# Patient Record
Sex: Female | Born: 1970 | Race: White | Hispanic: No | Marital: Married | Smoking: Current every day smoker
Health system: Southern US, Community
[De-identification: ages and names within clinical notes are randomized; demographics above are authoritative.]

## PROBLEM LIST (undated history)

## (undated) DIAGNOSIS — Z789 Other specified health status: Secondary | ICD-10-CM

## (undated) HISTORY — PX: ABDOMINAL HYSTERECTOMY: SHX81

## (undated) HISTORY — PX: TOTAL ABDOMINAL HYSTERECTOMY: SHX209

## (undated) HISTORY — DX: Other specified health status: Z78.9

## (undated) HISTORY — PX: APPENDECTOMY: SHX54

---

## 2010-01-16 ENCOUNTER — Encounter: Admission: RE | Admit: 2010-01-16 | Discharge: 2010-01-16 | Payer: Self-pay | Admitting: Family Medicine

## 2010-04-01 ENCOUNTER — Encounter: Payer: Self-pay | Admitting: Family Medicine

## 2010-04-13 ENCOUNTER — Other Ambulatory Visit: Payer: Self-pay | Admitting: Family Medicine

## 2010-04-13 DIAGNOSIS — G5700 Lesion of sciatic nerve, unspecified lower limb: Secondary | ICD-10-CM

## 2010-04-13 DIAGNOSIS — M545 Low back pain: Secondary | ICD-10-CM

## 2010-04-13 DIAGNOSIS — M5416 Radiculopathy, lumbar region: Secondary | ICD-10-CM

## 2010-04-18 ENCOUNTER — Other Ambulatory Visit: Payer: Self-pay | Admitting: Family Medicine

## 2010-04-18 DIAGNOSIS — G5700 Lesion of sciatic nerve, unspecified lower limb: Secondary | ICD-10-CM

## 2010-04-24 ENCOUNTER — Ambulatory Visit
Admission: RE | Admit: 2010-04-24 | Discharge: 2010-04-24 | Disposition: A | Discharge status: Home / Self Care | Payer: BC Managed Care – PPO | Source: Ambulatory Visit | Attending: Family Medicine | Admitting: Family Medicine

## 2010-04-24 DIAGNOSIS — G5700 Lesion of sciatic nerve, unspecified lower limb: Secondary | ICD-10-CM

## 2010-04-24 DIAGNOSIS — M545 Low back pain: Secondary | ICD-10-CM

## 2010-04-24 DIAGNOSIS — M5416 Radiculopathy, lumbar region: Secondary | ICD-10-CM

## 2010-07-23 ENCOUNTER — Other Ambulatory Visit: Payer: Self-pay | Admitting: *Deleted

## 2010-07-23 DIAGNOSIS — M545 Low back pain, unspecified: Secondary | ICD-10-CM

## 2010-07-23 DIAGNOSIS — M5410 Radiculopathy, site unspecified: Secondary | ICD-10-CM

## 2010-07-23 DIAGNOSIS — G5700 Lesion of sciatic nerve, unspecified lower limb: Secondary | ICD-10-CM

## 2010-07-27 ENCOUNTER — Other Ambulatory Visit: Payer: Self-pay | Admitting: *Deleted

## 2010-07-27 ENCOUNTER — Ambulatory Visit
Admission: RE | Admit: 2010-07-27 | Discharge: 2010-07-27 | Disposition: A | Discharge status: Home / Self Care | Payer: BC Managed Care – PPO | Source: Ambulatory Visit | Attending: *Deleted | Admitting: *Deleted

## 2010-07-27 DIAGNOSIS — G5700 Lesion of sciatic nerve, unspecified lower limb: Secondary | ICD-10-CM

## 2010-07-27 DIAGNOSIS — M5410 Radiculopathy, site unspecified: Secondary | ICD-10-CM

## 2010-07-27 DIAGNOSIS — M545 Low back pain: Secondary | ICD-10-CM

## 2010-11-27 ENCOUNTER — Other Ambulatory Visit: Payer: Self-pay | Admitting: *Deleted

## 2010-11-27 DIAGNOSIS — M545 Low back pain: Secondary | ICD-10-CM

## 2010-11-27 DIAGNOSIS — G5700 Lesion of sciatic nerve, unspecified lower limb: Secondary | ICD-10-CM

## 2010-11-27 DIAGNOSIS — M5410 Radiculopathy, site unspecified: Secondary | ICD-10-CM

## 2010-12-06 ENCOUNTER — Inpatient Hospital Stay
Admission: RE | Admit: 2010-12-06 | Payer: BC Managed Care – PPO | Source: Ambulatory Visit | Attending: *Deleted | Admitting: *Deleted

## 2010-12-11 ENCOUNTER — Other Ambulatory Visit: Payer: Self-pay | Admitting: Family Medicine

## 2010-12-11 ENCOUNTER — Ambulatory Visit
Admission: RE | Admit: 2010-12-11 | Discharge: 2010-12-11 | Disposition: A | Discharge status: Home / Self Care | Payer: BC Managed Care – PPO | Source: Ambulatory Visit | Attending: Family Medicine | Admitting: Family Medicine

## 2010-12-11 ENCOUNTER — Ambulatory Visit
Admission: RE | Admit: 2010-12-11 | Discharge: 2010-12-11 | Disposition: A | Discharge status: Home / Self Care | Payer: BC Managed Care – PPO | Source: Ambulatory Visit | Attending: *Deleted | Admitting: *Deleted

## 2010-12-11 DIAGNOSIS — M5410 Radiculopathy, site unspecified: Secondary | ICD-10-CM

## 2010-12-11 DIAGNOSIS — M545 Low back pain: Secondary | ICD-10-CM

## 2010-12-11 DIAGNOSIS — G5700 Lesion of sciatic nerve, unspecified lower limb: Secondary | ICD-10-CM

## 2010-12-27 ENCOUNTER — Other Ambulatory Visit: Payer: Self-pay | Admitting: Family Medicine

## 2010-12-27 DIAGNOSIS — M5410 Radiculopathy, site unspecified: Secondary | ICD-10-CM

## 2010-12-27 DIAGNOSIS — M545 Low back pain: Secondary | ICD-10-CM

## 2011-01-22 ENCOUNTER — Inpatient Hospital Stay: Admission: RE | Admit: 2011-01-22 | Payer: BC Managed Care – PPO | Source: Ambulatory Visit

## 2012-06-01 ENCOUNTER — Other Ambulatory Visit: Payer: Self-pay | Admitting: Neurosurgery

## 2012-06-01 DIAGNOSIS — M48061 Spinal stenosis, lumbar region without neurogenic claudication: Secondary | ICD-10-CM

## 2012-06-08 ENCOUNTER — Other Ambulatory Visit: Payer: Self-pay | Admitting: Neurosurgery

## 2012-06-08 ENCOUNTER — Ambulatory Visit
Admission: RE | Admit: 2012-06-08 | Discharge: 2012-06-08 | Disposition: A | Discharge status: Home / Self Care | Payer: BC Managed Care – PPO | Source: Ambulatory Visit | Attending: Neurosurgery | Admitting: Neurosurgery

## 2012-06-08 DIAGNOSIS — M48061 Spinal stenosis, lumbar region without neurogenic claudication: Secondary | ICD-10-CM

## 2018-04-16 DIAGNOSIS — R1011 Right upper quadrant pain: Secondary | ICD-10-CM | POA: Diagnosis not present

## 2018-04-16 DIAGNOSIS — R1013 Epigastric pain: Secondary | ICD-10-CM | POA: Diagnosis not present

## 2018-04-16 DIAGNOSIS — R197 Diarrhea, unspecified: Secondary | ICD-10-CM | POA: Diagnosis not present

## 2018-04-16 DIAGNOSIS — R110 Nausea: Secondary | ICD-10-CM | POA: Diagnosis not present

## 2018-04-20 DIAGNOSIS — R1011 Right upper quadrant pain: Secondary | ICD-10-CM | POA: Diagnosis not present

## 2018-04-20 DIAGNOSIS — Z0189 Encounter for other specified special examinations: Secondary | ICD-10-CM | POA: Diagnosis not present

## 2018-08-11 ENCOUNTER — Emergency Department
Admission: EM | Admit: 2018-08-11 | Discharge: 2018-08-11 | Disposition: A | Discharge status: Home / Self Care | Payer: BLUE CROSS/BLUE SHIELD | Source: Home / Self Care | Attending: Family Medicine | Admitting: Family Medicine

## 2018-08-11 ENCOUNTER — Ambulatory Visit (INDEPENDENT_AMBULATORY_CARE_PROVIDER_SITE_OTHER): Payer: BLUE CROSS/BLUE SHIELD | Admitting: Family Medicine

## 2018-08-11 ENCOUNTER — Emergency Department (INDEPENDENT_AMBULATORY_CARE_PROVIDER_SITE_OTHER): Payer: BLUE CROSS/BLUE SHIELD

## 2018-08-11 ENCOUNTER — Other Ambulatory Visit: Payer: Self-pay

## 2018-08-11 DIAGNOSIS — X58XXXA Exposure to other specified factors, initial encounter: Secondary | ICD-10-CM

## 2018-08-11 DIAGNOSIS — S92501A Displaced unspecified fracture of right lesser toe(s), initial encounter for closed fracture: Secondary | ICD-10-CM

## 2018-08-11 DIAGNOSIS — S92511A Displaced fracture of proximal phalanx of right lesser toe(s), initial encounter for closed fracture: Secondary | ICD-10-CM | POA: Diagnosis not present

## 2018-08-11 IMAGING — DX RIGHT FOOT COMPLETE - 3+ VIEW
3 series · 3 of 3 positions shown · non-contrast
Comparison: None.

CLINICAL DATA: Trauma to fourth digit.  Pain.

EXAM:
RIGHT FOOT COMPLETE - 3+ VIEW

[foot ap]
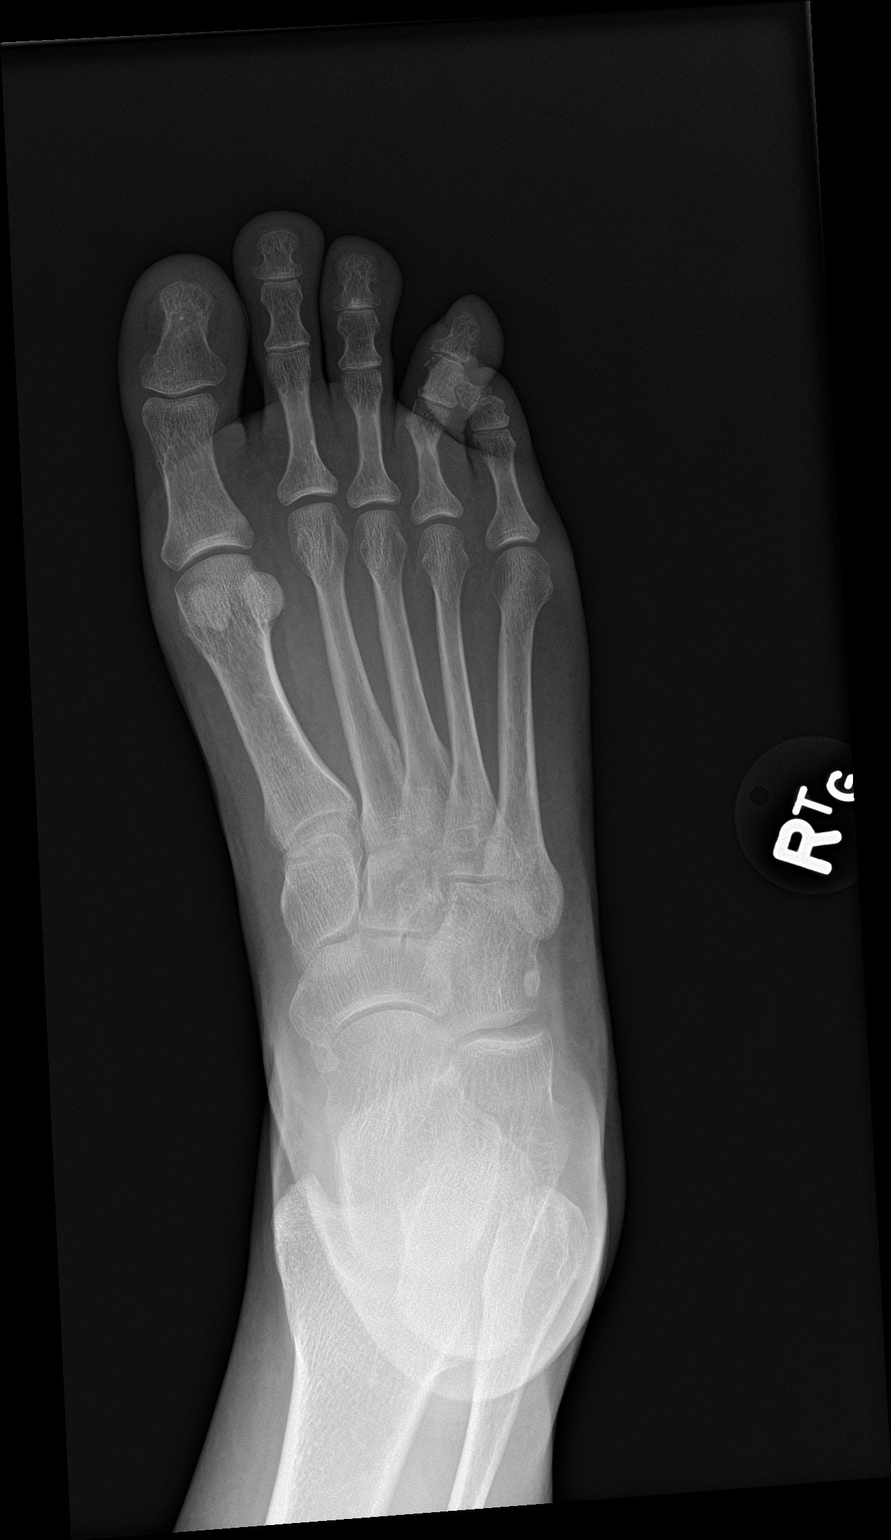

[foot obl]
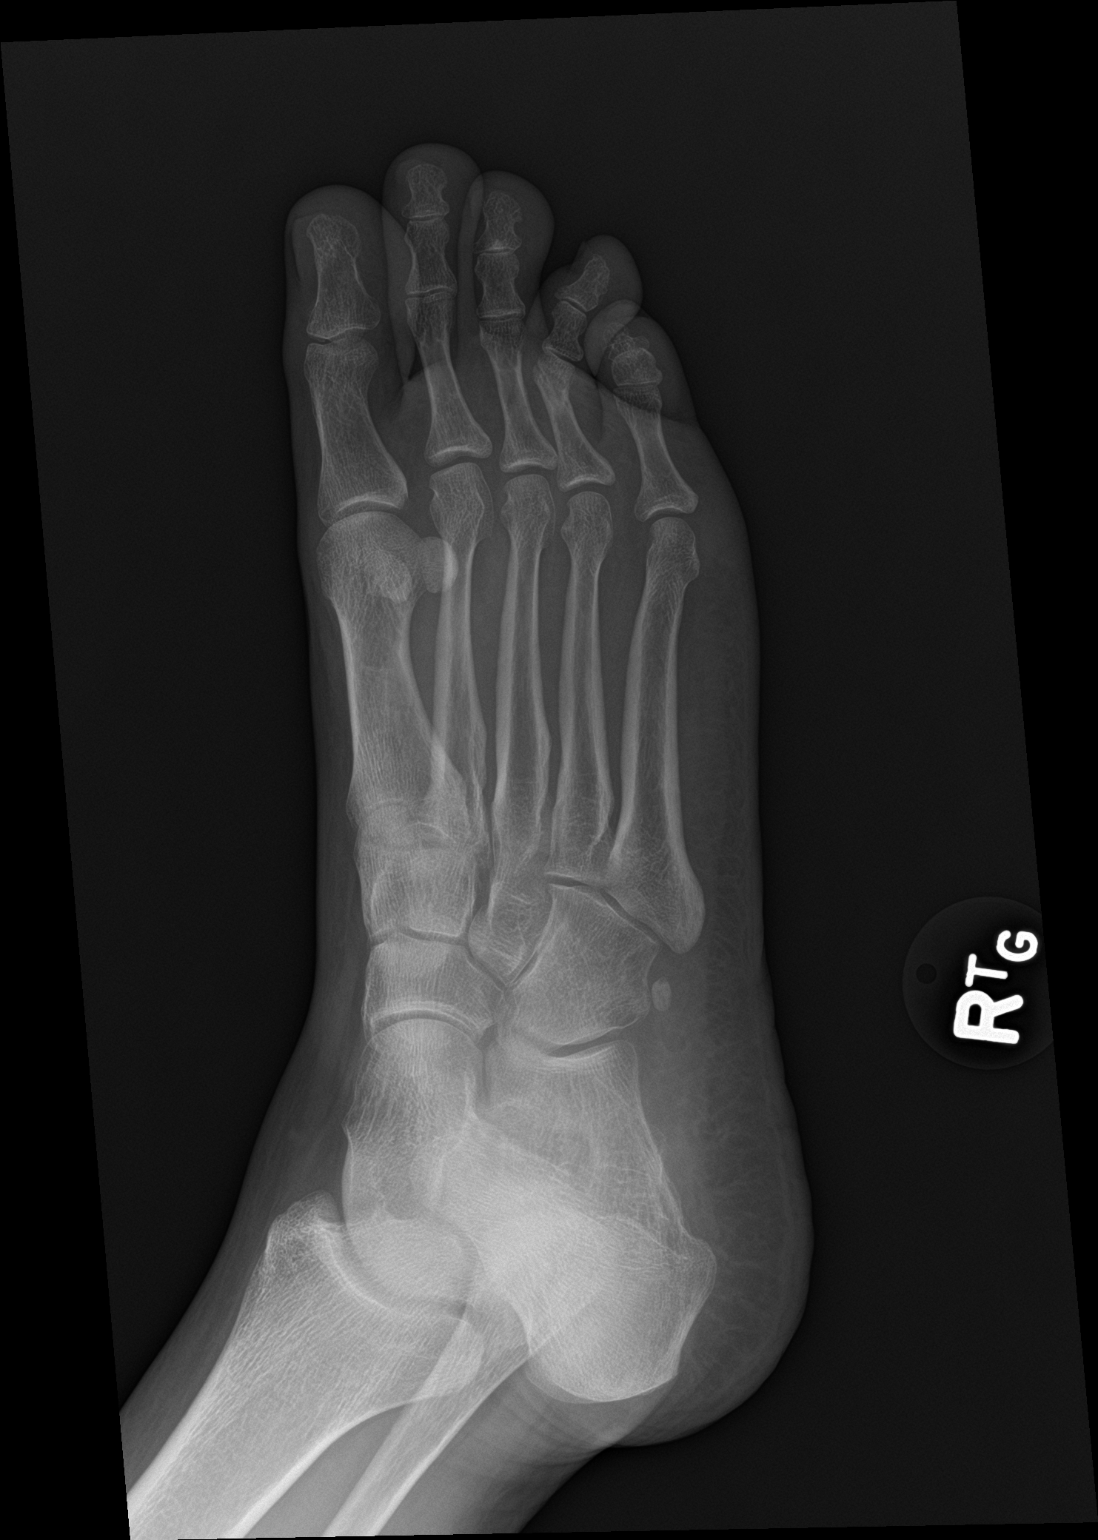

[foot lat]
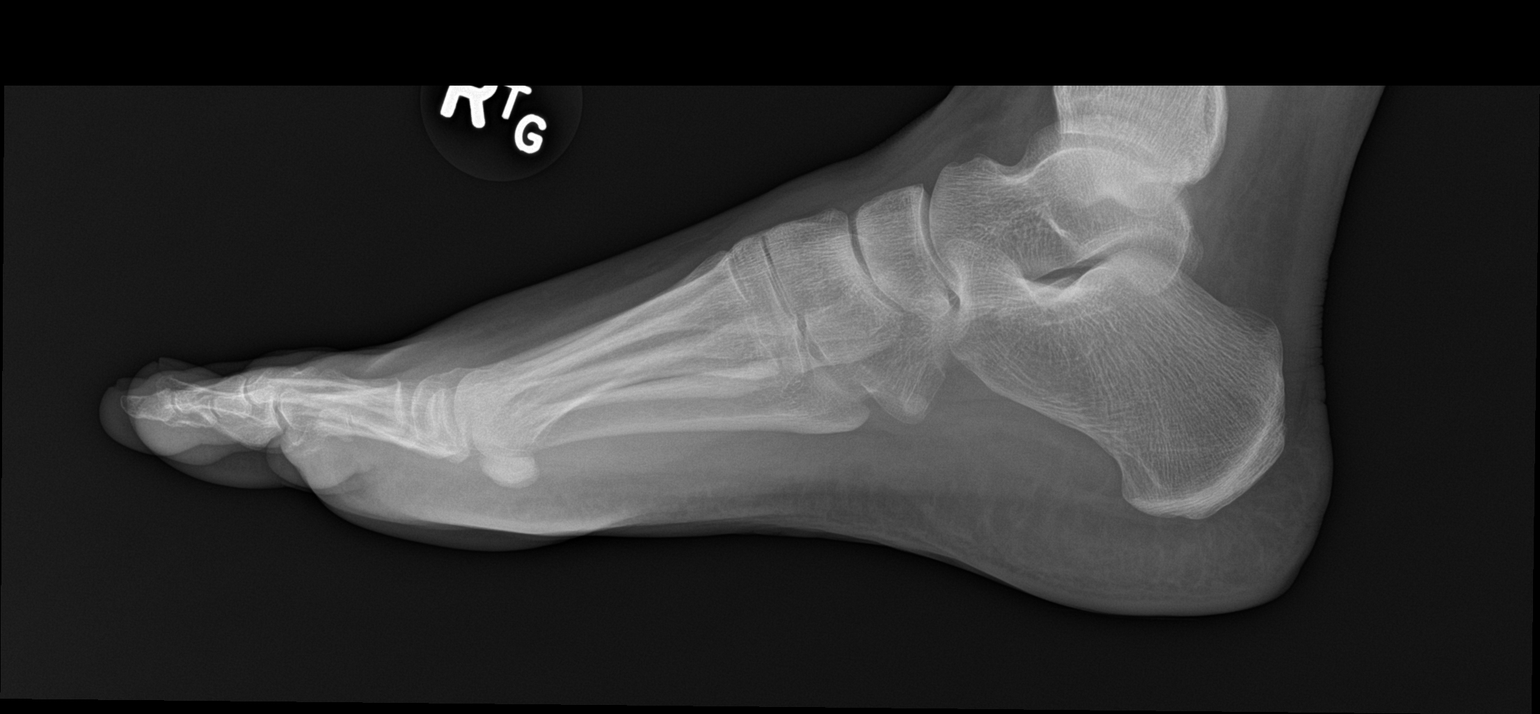

[3 of 3 positions shown; findings below may reference images not displayed]

FINDINGS: There is a obliquely oriented fracture deformity involving the
distal shaft of the fourth proximal phalanx. There is lateral
angulation of the distal fracture fragments. No dislocation
identified.
IMPRESSION: 1. Fracture of the distal shaft of fourth proximal phalanx with
lateral angulation of the distal fracture fragments.

## 2018-08-11 NOTE — Progress Notes (Signed)
    Subjective:    CC: Right fourth toe fracture.  HPI: Starting today stubbed right 4th toe.  She developed immediate pain.  She noticed that the fourth toe was displaced to the lateral foot.  She presents to urgent care today for evaluation and treatment.  In urgent care she was found to have a displaced angulated fracture at the proximal phalanx of the fourth toe.  I was consulted for evaluation of potential fracture reduction.   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    Pertinent medical history reviewed.  Review of Systems: No headache, visual changes, nausea, vomiting, diarrhea, constipation, dizziness, abdominal pain, skin rash, fevers, chills, night sweats, weight loss, swollen lymph nodes, body aches, joint swelling, muscle aches, chest pain, shortness of breath, mood changes, visual or auditory hallucinations.   Objective:   BP 137/82 (BP Location: Right Arm)   Pulse 87   Temp 98.5 F (36.9 C) (Tympanic)   Resp 20   Ht 5\' 4"  (1.626 m)   Wt 64.4 kg   LMP 04/09/2010   SpO2 99%   BMI 24.37 kg/m General: Well Developed, well nourished, and in no acute distress.  Neuro/Psych: Alert and oriented x3, extra-ocular muscles intact, able to move all 4 extremities, sensation grossly intact. Skin: Warm and dry, no rashes noted.  Respiratory: Not using accessory muscles, speaking in full sentences, trachea midline.  Cardiovascular: Pulses palpable, no extremity edema. Abdomen: Does not appear distended. MSK:  Right foot angulated fourth toe displaced laterally.  Ecchymosis present.  Capillary fill and sensation are intact distally.  Pulses intact at the foot.  Toe motion otherwise normal.  Fracture reduction: Consent obtained and timeout performed. Skin cleaned with rubbing alcohol and digital block achieved good anesthesia. Fracture was elongated and moved more medially achieving good anatomical alignment.  Buddy tape was applied.   Patient tolerated procedure well.  Lab and Radiology Results No results found for this or any previous visit (from the past 72 hour(s)). Dg Foot Complete Right  Result Date: 08/11/2018 CLINICAL DATA:  Trauma to fourth digit.  Pain. EXAM: RIGHT FOOT COMPLETE - 3+ VIEW COMPARISON:  None. FINDINGS: There is a obliquely oriented fracture deformity involving the distal shaft of the fourth proximal phalanx. There is lateral angulation of the distal fracture fragments. No dislocation identified. IMPRESSION: 1. Fracture of the distal shaft of fourth proximal phalanx with lateral angulation of the distal fracture fragments. Electronically Signed   By: Signa Kell M.D.   On: 08/11/2018 11:02  I personally (independently) visualized and performed the interpretation of the images attached in this note.   Impression and Recommendations:    Assessment and Plan: 48 y.o. female with right fourth toe fracture.  Doing well status post reduction.  Patient declined follow-up postreduction films which I think is pretty reasonable since she had good anatomical alignment.  Plan to recheck in about 1 week.  Proceed with postop shoe.  Recheck sooner if needed.  Discussed medication management if needed.  Patient will use over-the-counter prescription strength some medications..    Discussed warning signs or symptoms. Please see discharge instructions. Patient expresses understanding.

## 2018-08-11 NOTE — ED Provider Notes (Signed)
[name]    CSN: [id number] Arrival date & time: 08/11/18  7116     History   Chief Complaint Chief Complaint  Patient presents with  . Foot Injury    HPI Tammy Casey is a 48 y.o. female.   At about 4am today patient bumped her right foot against a chest, resulting in pain and deformity to her right 4th toe.  The history is provided by the patient.  Toe Pain  This is a new problem. The current episode started 6 to 12 hours ago. The problem occurs constantly. The problem has not changed since onset.The symptoms are aggravated by walking. Nothing relieves the symptoms. She has tried nothing for the symptoms.    History reviewed. No pertinent past medical history.  There are no active problems to display for this patient.   Past Surgical History:  Procedure Laterality Date  . ABDOMINAL HYSTERECTOMY      OB History   No obstetric history on file.      Home Medications    Prior to Admission medications   Not on File    Family History History reviewed. No pertinent family history.  Social History Social History   Tobacco Use  . Smoking status: Not on file  Substance Use Topics  . Alcohol use: Not on file  . Drug use: Not on file     Allergies   Patient has no allergy information on record.   Review of Systems Review of Systems  All other systems reviewed and are negative.    Physical Exam Triage Vital Signs ED Triage Vitals  Enc Vitals Group     BP 08/11/18 1041 137/82     Pulse Rate 08/11/18 1041 87     Resp 08/11/18 1041 20     Temp 08/11/18 1041 98.5 F (36.9 C)     Temp Source 08/11/18 1041 Tympanic     SpO2 08/11/18 1041 99 %     Weight 08/11/18 1042 142 lb (64.4 kg)     Height 08/11/18 1042 5\' 4"  (1.626 m)     Head Circumference --      Peak Flow --      Pain Score 08/11/18 1041 7     Pain Loc --      Pain Edu? --      Excl. in GC? --    No data found.  Updated Vital Signs BP 137/82 (BP Location: Right  Arm)   Pulse 87   Temp 98.5 F (36.9 C) (Tympanic)   Resp 20   Ht 5\' 4"  (1.626 m)   Wt 64.4 kg   LMP 04/09/2010   SpO2 99%   BMI 24.37 kg/m   Visual Acuity Right Eye Distance:   Left Eye Distance:   Bilateral Distance:    Right Eye Near:   Left Eye Near:    Bilateral Near:     Physical Exam Vitals signs and nursing note reviewed.  Constitutional:      General: She is not in acute distress. HENT:     Head: Atraumatic.  Eyes:     Pupils: Pupils are equal, round, and reactive to light.  Cardiovascular:     Rate and Rhythm: Normal rate.  Pulmonary:     Effort: Pulmonary effort is normal.  Musculoskeletal:     Right foot: Decreased range of motion. Normal capillary refill. Tenderness, bony tenderness, swelling and deformity present.       Feet:     Comments: Right  fourth toe angulated laterally and tender to palpation at its base.  Distal neurovascular function is intact.   Skin:    General: Skin is warm and dry.  Neurological:     Mental Status: She is alert.      UC Treatments / Results  Labs (all labs ordered are listed, but only abnormal results are displayed) Labs Reviewed - No data to display  EKG None  Radiology Dg Foot Complete Right  Result Date: 08/11/2018 CLINICAL DATA:  Trauma to fourth digit.  Pain. EXAM: RIGHT FOOT COMPLETE - 3+ VIEW COMPARISON:  None. FINDINGS: There is a obliquely oriented fracture deformity involving the distal shaft of the fourth proximal phalanx. There is lateral angulation of the distal fracture fragments. No dislocation identified. IMPRESSION: 1. Fracture of the distal shaft of fourth proximal phalanx with lateral angulation of the distal fracture fragments. Electronically Signed   By: Signa Kellaylor  Stroud M.D.   On: 08/11/2018 11:02    Procedures Procedures (including critical care time)  Medications Ordered in UC Medications - No data to display  Initial Impression / Assessment and Plan / UC Course  I have reviewed the  triage vital signs and the nursing notes.  Pertinent labs & imaging results that were available during my care of the patient were reviewed by me and considered in my medical decision making (see chart for details).    Patient referred to Dr. Clementeen GrahamEvan Corey for reduction and fracture management.   Final Clinical Impressions(s) / UC Diagnoses   Final diagnoses:  Closed fracture of phalanx of right fourth toe, initial encounter   Discharge Instructions   None    ED Prescriptions    None        Lattie HawBeese,  A, MD 08/12/18 1215

## 2018-08-11 NOTE — ED Triage Notes (Signed)
Pt hit 4th digit on right foot against a chest this am at 4.  Toe purple, swollen, and disfigured.

## 2018-08-11 NOTE — Patient Instructions (Addendum)
Thank you for coming in today. Recheck with me in 7-10 days.  You can rent a knee scooter from [facility] medical supply.  Pain control with Advil or aleve or tylenol.    How to Buddy Tape  Buddy taping refers to taping an injured finger or toe to an uninjured finger or toe that is next to it. This protects the injured finger or toe and keeps it from moving while the injury heals. You may buddy tape a finger or toe if you have a minor sprain. Your health care provider may buddy tape your finger or toe if you have a sprain, dislocation, or fracture. You may be told to replace your buddy taping as needed. What are the risks? Generally, buddy taping is safe. However, problems may occur, such as:  Skin injury or infection.  Reduced blood flow to the finger or toe.  Skin reaction to the tape. Do not buddy tape your toe if you have diabetes. Do not buddy tape if you know that you have an allergy to adhesives or surgical tape. How to buddy tape Before Buddy Taping Try to reduce any pain and swelling with rest, icing, and elevation:  Avoid any activity that causes pain.  Raise (elevate) your hand or foot above the level of your heart while you are sitting or lying down.  If directed, apply ice to the injured area: ? Put ice in a plastic bag. ? Place a towel between your skin and the bag. ? Leave the ice on for 20 minutes, 2-3 times per day. Buddy Taping Procedure  Clean and dry your finger or toe as told by your health care provider.  Place a gauze pad or a piece of cloth or cotton between your injured finger or toe and the uninjured finger or toe.  Use tape to wrap around both fingers or toes so your injured finger or toe is secured to the uninjured finger or toe. ? The tape should be snug, but not tight. ? Make sure the ends of the piece of tape overlap. ? Avoid placing tape directly over the joint.  Change the tape and the padding as told by your health care provider. Remove and  replace the tape or padding if it becomes loose, worn, dirty, or wet. After Buddy Taping  Take over-the-counter and prescription medicines only as told by your health care provider.  Return to your normal activities as told by your health care provider. Ask your health care provider what activities are safe for you.  Watch the buddy-taped area and always remove buddy taping if: ? Your pain gets worse. ? Your fingers turn pale or blue. ? Your skin becomes irritated. Contact a health care provider if:  You have pain, swelling, or bruising that lasts longer than three days.  You have a fever.  Your skin is red, cracked, or irritated. Get help right away if:  The injured area becomes cold, numb, or pale.  You have severe pain, swelling, bruising, or loss of movement in your finger or toe.  Your finger or toe changes shape (deformity). This information is not intended to replace advice given to you by your health care provider. Make sure you discuss any questions you have with your health care provider. Document Released: 04/04/2004 Document Revised: 08/03/2015 Document Reviewed: 07/20/2014 Elsevier Interactive Patient Education  2019 [employer].

## 2018-08-12 ENCOUNTER — Telehealth: Payer: Self-pay

## 2018-08-12 MED ORDER — HYDROCODONE-ACETAMINOPHEN 5-325 MG PO TABS: 1.0000 | ORAL_TABLET | Freq: Four times a day (QID) | ORAL | 0 refills | Status: DC | PRN

## 2018-08-12 NOTE — Telephone Encounter (Signed)
I called patient back.  She notes that she is having quite a bit of pain and swelling into her foot.  She notes the Tylenol and over-the-counter NSAIDs are not effective to control pain.  She is still working.  Plan to use limited hydrocodone.  She is done okay on this in the past.  Recheck early next week.  Update if not improving.  PDMP reviewed during this encounter.

## 2018-08-12 NOTE — Telephone Encounter (Signed)
Pt called. She said the Tylenol is not working. Thanks

## 2018-08-17 ENCOUNTER — Encounter: Payer: Self-pay | Admitting: Family Medicine

## 2018-08-17 ENCOUNTER — Ambulatory Visit (INDEPENDENT_AMBULATORY_CARE_PROVIDER_SITE_OTHER): Payer: BLUE CROSS/BLUE SHIELD | Admitting: Family Medicine

## 2018-08-17 ENCOUNTER — Ambulatory Visit (INDEPENDENT_AMBULATORY_CARE_PROVIDER_SITE_OTHER): Payer: BLUE CROSS/BLUE SHIELD

## 2018-08-17 ENCOUNTER — Other Ambulatory Visit: Payer: Self-pay

## 2018-08-17 VITALS — BP 143/90 | HR 78 | Temp 97.8°F | Wt 144.0 lb

## 2018-08-17 DIAGNOSIS — S92521A Displaced fracture of medial phalanx of right lesser toe(s), initial encounter for closed fracture: Secondary | ICD-10-CM | POA: Diagnosis not present

## 2018-08-17 DIAGNOSIS — S92501A Displaced unspecified fracture of right lesser toe(s), initial encounter for closed fracture: Secondary | ICD-10-CM

## 2018-08-17 IMAGING — DX RIGHT FOOT COMPLETE - 3+ VIEW
3 series · 3 of 3 positions shown · non-contrast
Comparison: [DATE]

CLINICAL DATA: Recent fracture of fourth proximal phalanx

EXAM:
RIGHT FOOT COMPLETE - 3+ VIEW

[foot ap]
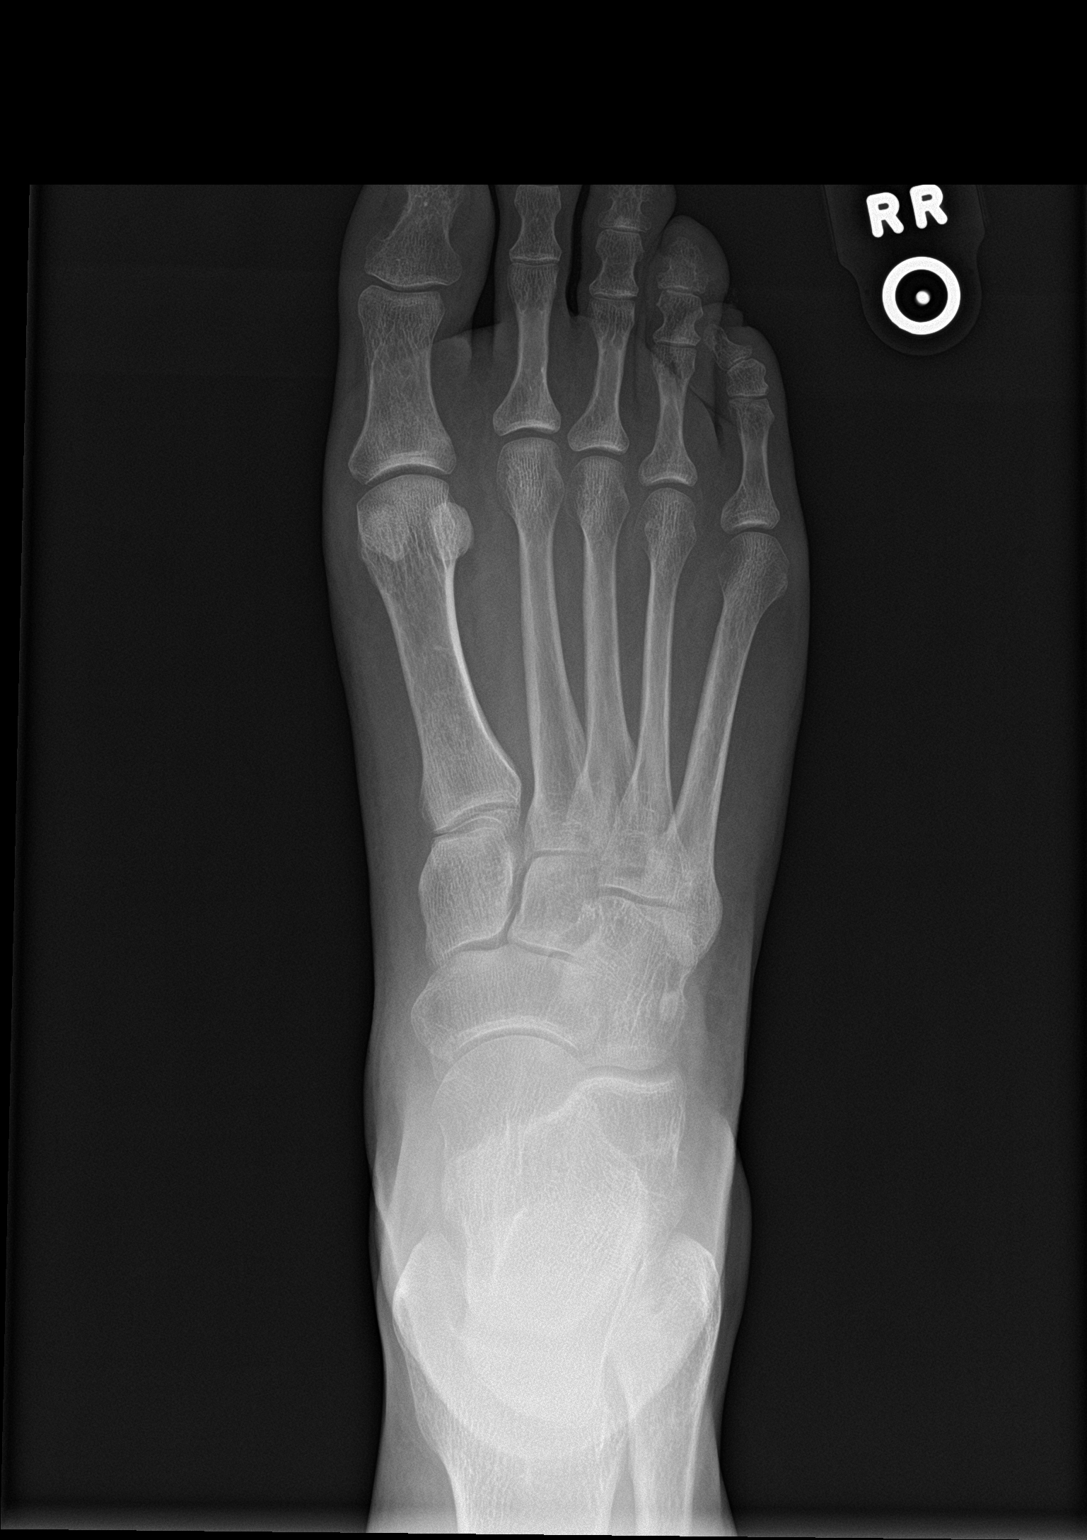

[foot obl]
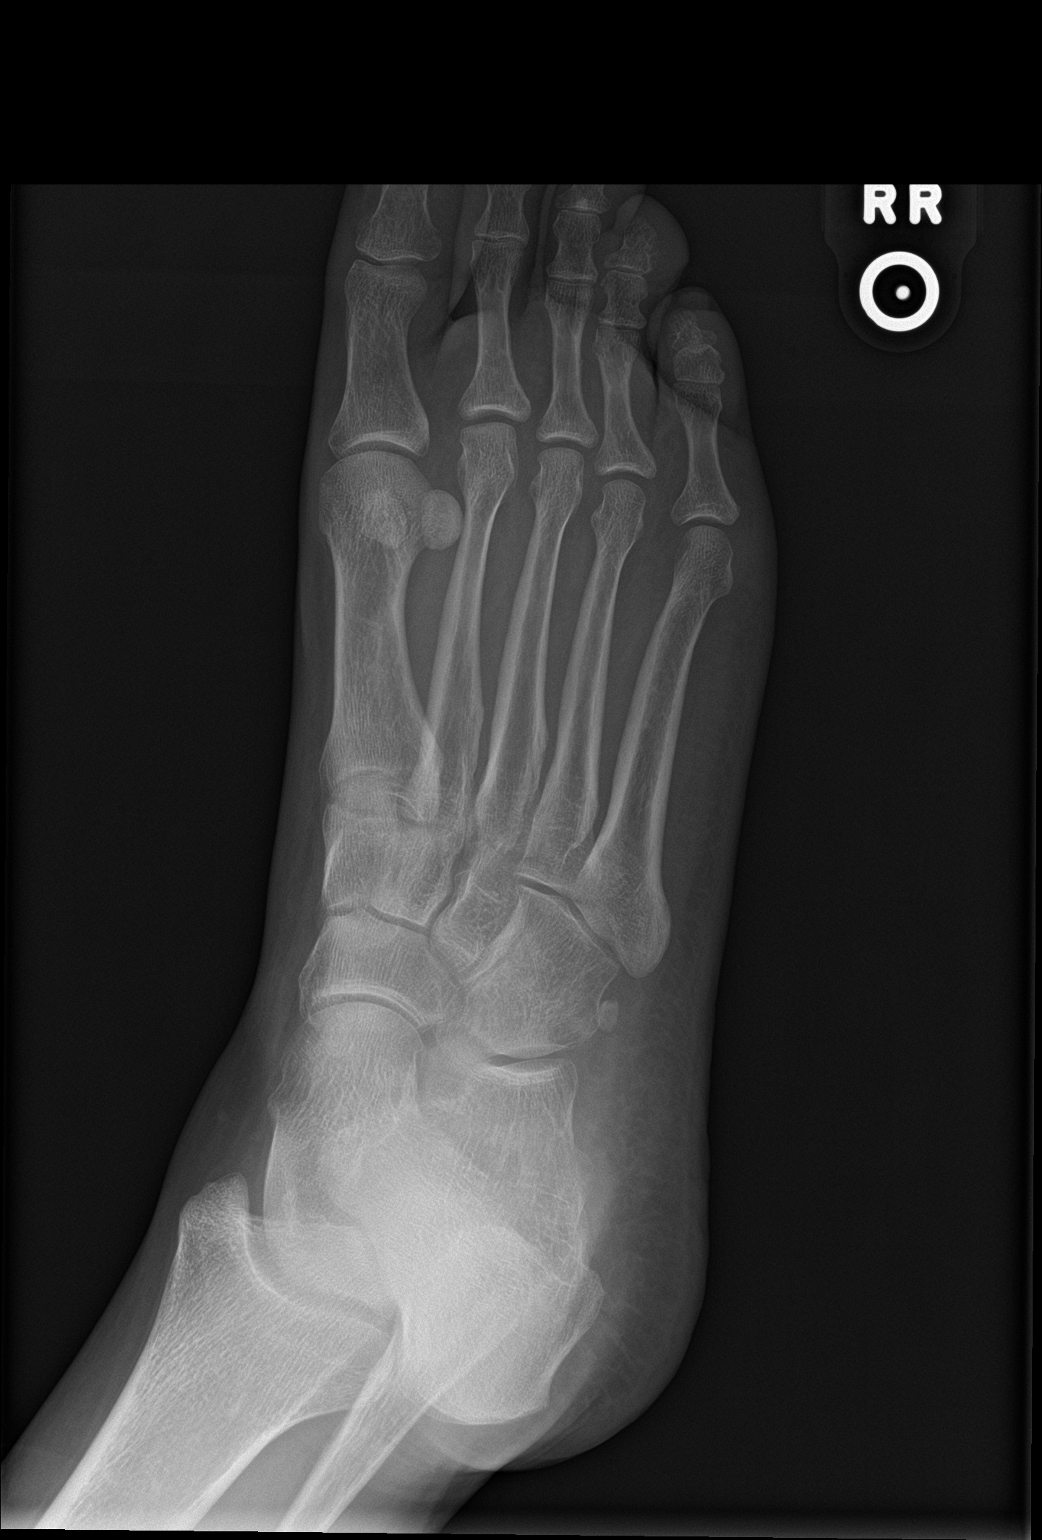

[foot lat]
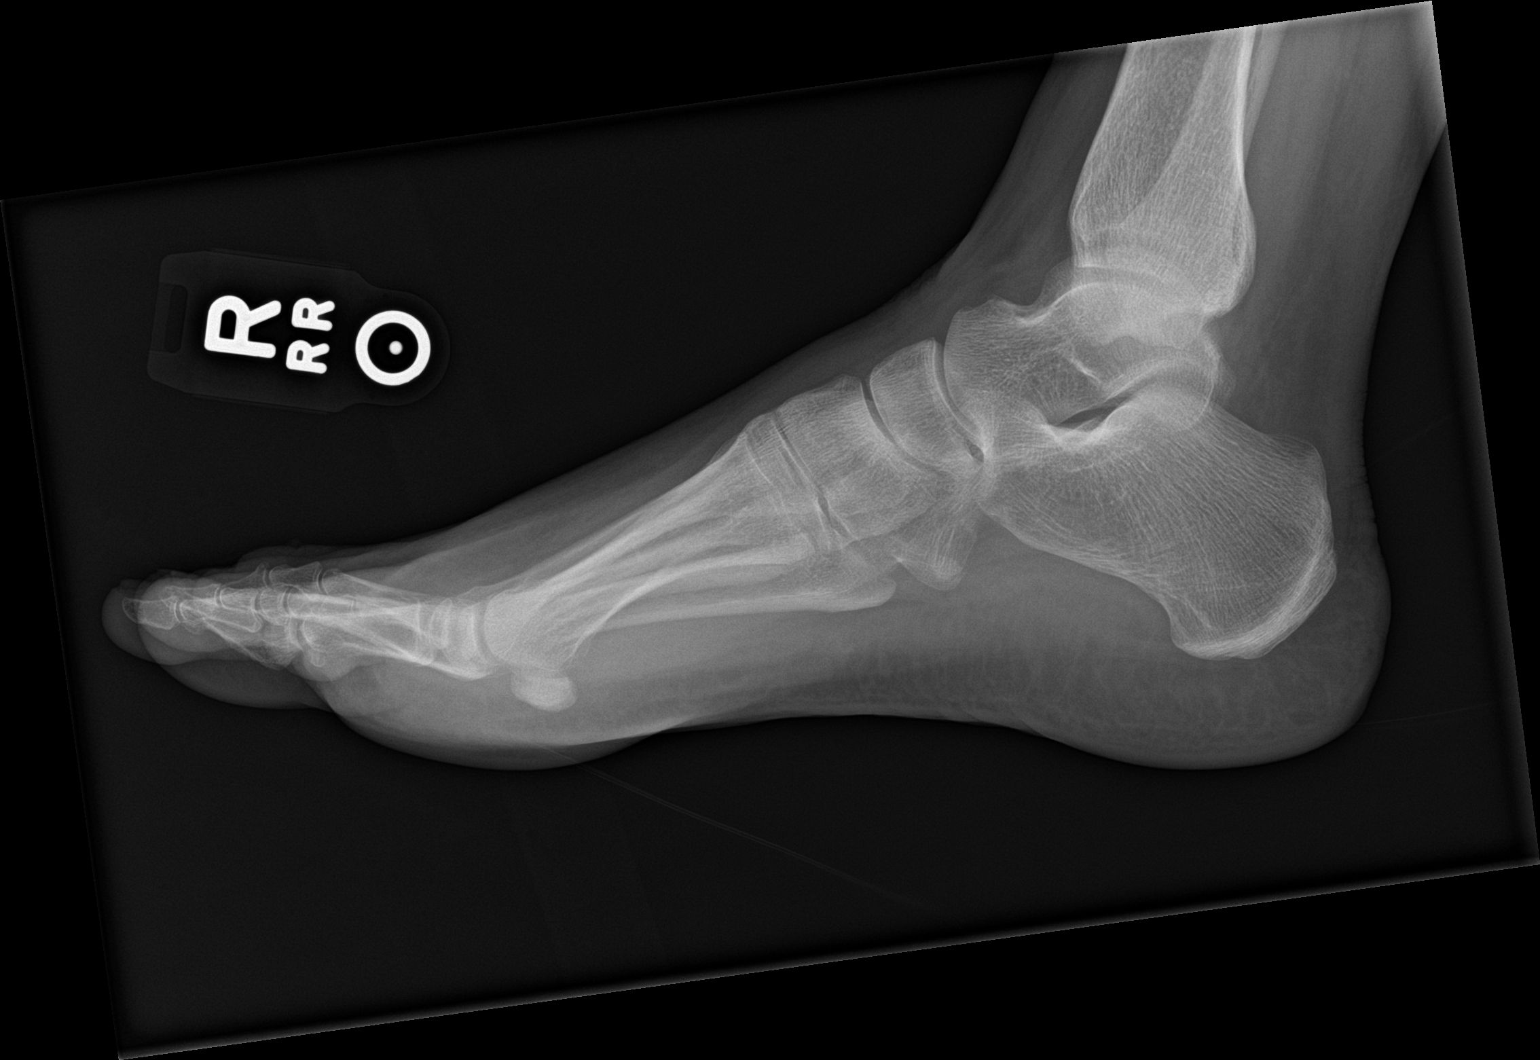

[3 of 3 positions shown; findings below may reference images not displayed]

FINDINGS: Frontal, oblique, and lateral views obtained. The previous fracture
of the distal aspect of the fourth proximal phalanx is now in
anatomic alignment. A small avulsion arising from the medial distal
aspect of the fourth middle phalanx is unchanged in in near anatomic
alignment. No new fracture. No dislocation. The joint spaces appear
normal. No erosive change
IMPRESSION: Fracture of the distal aspect of the fourth proximal phalanx is
currently in anatomic alignment. A small avulsion along the medial
distal aspect of the fourth middle phalanx is stable. No new
fracture or dislocation. No appreciable arthropathy.

## 2018-08-17 NOTE — Patient Instructions (Signed)
Thank you for coming in today. Continue buddy tape.  Use the post op shoe.  Ok to wean into regular shoe when able.  Recheck in about 3 weeks.

## 2018-08-17 NOTE — Progress Notes (Signed)
Tammy Casey is a 48 y.o. female who presents to [facility] today for toe fracture.  Patient was seen in urgent care on June 2.  At that time she had a angulated displaced fracture of the proximal phalanx of the fourth toe on her right foot.  This is reduced in clinic.  Treated with buddy tape and postop shoe.  She is here today for follow-up.  In the interim she notes that her foot remains sore.  She continues to do a lot of ambulation.  She overall is pretty happy with how things are going however.    ROS:  As above  Exam:  BP (!) 143/90   Pulse 78   Temp 97.8 F (36.6 C) (Oral)   Wt 144 lb (65.3 kg)   LMP 04/09/2010   BMI 24.72 kg/m  Wt Readings from Last 5 Encounters:  08/17/18 144 lb (65.3 kg)  08/11/18 142 lb (64.4 kg)   General: Well Developed, well nourished, and in no acute distress.  Neuro/Psych: Alert and oriented x3, extra-ocular muscles intact, able to move all 4 extremities, sensation grossly intact. Skin: Warm and dry, no rashes noted.  Respiratory: Not using accessory muscles, speaking in full sentences, trachea midline.  Cardiovascular: Pulses palpable, no extremity edema. Abdomen: Does not appear distended. MSK: Right foot fourth toe still slightly swollen and MTP however not dramatically so.  No severe bruising.  Tender palpation at MTP and PIP.  Cap refill and sensation are intact distally.    Lab and Radiology Results No results found for this or any previous visit (from the past 72 hour(s)). Dg Foot Complete Right  Result Date: 08/17/2018 CLINICAL DATA:  Recent fracture of fourth proximal phalanx EXAM: RIGHT FOOT COMPLETE - 3+ VIEW COMPARISON:  August 11, 2018 FINDINGS: Frontal, oblique, and lateral views obtained. The previous fracture of the distal aspect of the fourth proximal phalanx is now in anatomic alignment. A small avulsion arising from the medial distal aspect of the fourth middle phalanx is unchanged  in in near anatomic alignment. No new fracture. No dislocation. The joint spaces appear normal. No erosive change IMPRESSION: Fracture of the distal aspect of the fourth proximal phalanx is currently in anatomic alignment. A small avulsion along the medial distal aspect of the fourth middle phalanx is stable. No new fracture or dislocation. No appreciable arthropathy. Electronically Signed   By: Lowella Grip III M.D.   On: 08/17/2018 09:20    I personally (independently) visualized and performed the interpretation of the images attached in this note.   Assessment and Plan: 48 y.o. female with fracture doing well status post reduction.  Plan for buddy tape and recheck in about 3 weeks.  Advance activity as tolerated.  Okay to wean out of postop shoe when able.   PDMP not reviewed this encounter. Orders Placed This Encounter  Procedures  . DG Foot Complete Right    Standing Status:   Future    Number of Occurrences:   1    Standing Expiration Date:   10/17/2019    Order Specific Question:   Reason for Exam (SYMPTOM  OR DIAGNOSIS REQUIRED)    Answer:   follow up fx    Order Specific Question:   Is patient pregnant?    Answer:   No    Order Specific Question:   Preferred imaging location?    Answer:   [facility]    Order Specific Question:   Radiology Contrast  Protocol - do NOT remove file path    Answer:   [email or web address]   No orders of the defined types were placed in this encounter.   Historical information moved to improve visibility of documentation.  No past medical history on file. Past Surgical History:  Procedure Laterality Date  . ABDOMINAL HYSTERECTOMY     Social History   Tobacco Use  . Smoking status: Current Every Day Smoker    Packs/day: 0.50  . Smokeless tobacco: Never Used  Substance Use Topics  . Alcohol use: Not on file   family history is not on file.  Medications: Current Outpatient Medications   Medication Sig Dispense Refill  . HYDROcodone-acetaminophen (NORCO/VICODIN) 5-325 MG tablet Take 1 tablet by mouth every 6 (six) hours as needed. 15 tablet 0   No current facility-administered medications for this visit.    Allergies  Allergen Reactions  . Amoxicillin Anaphylaxis and Diarrhea  . Penicillin G Anaphylaxis  . Azithromycin Nausea And Vomiting    C-Diff C-Diff   . Cefdinir Diarrhea      Discussed warning signs or symptoms. Please see discharge instructions. Patient expresses understanding.

## 2018-09-08 ENCOUNTER — Encounter: Payer: Self-pay | Admitting: Family Medicine

## 2018-09-08 ENCOUNTER — Ambulatory Visit (INDEPENDENT_AMBULATORY_CARE_PROVIDER_SITE_OTHER): Payer: BC Managed Care – PPO

## 2018-09-08 ENCOUNTER — Ambulatory Visit (INDEPENDENT_AMBULATORY_CARE_PROVIDER_SITE_OTHER): Payer: BC Managed Care – PPO | Admitting: Family Medicine

## 2018-09-08 ENCOUNTER — Other Ambulatory Visit: Payer: Self-pay

## 2018-09-08 VITALS — BP 137/95 | HR 102 | Wt 144.0 lb

## 2018-09-08 DIAGNOSIS — S92511A Displaced fracture of proximal phalanx of right lesser toe(s), initial encounter for closed fracture: Secondary | ICD-10-CM | POA: Diagnosis not present

## 2018-09-08 DIAGNOSIS — S92501A Displaced unspecified fracture of right lesser toe(s), initial encounter for closed fracture: Secondary | ICD-10-CM | POA: Diagnosis not present

## 2018-09-08 DIAGNOSIS — M7989 Other specified soft tissue disorders: Secondary | ICD-10-CM

## 2018-09-08 IMAGING — DX RIGHT FOOT COMPLETE - 3+ VIEW
3 series · 3 of 3 positions shown · non-contrast
Comparison: [DATE] and [DATE]

CLINICAL DATA: Recent fracture fourth proximal phalanx.

EXAM:
RIGHT FOOT COMPLETE - 3+ VIEW

[foot ap]
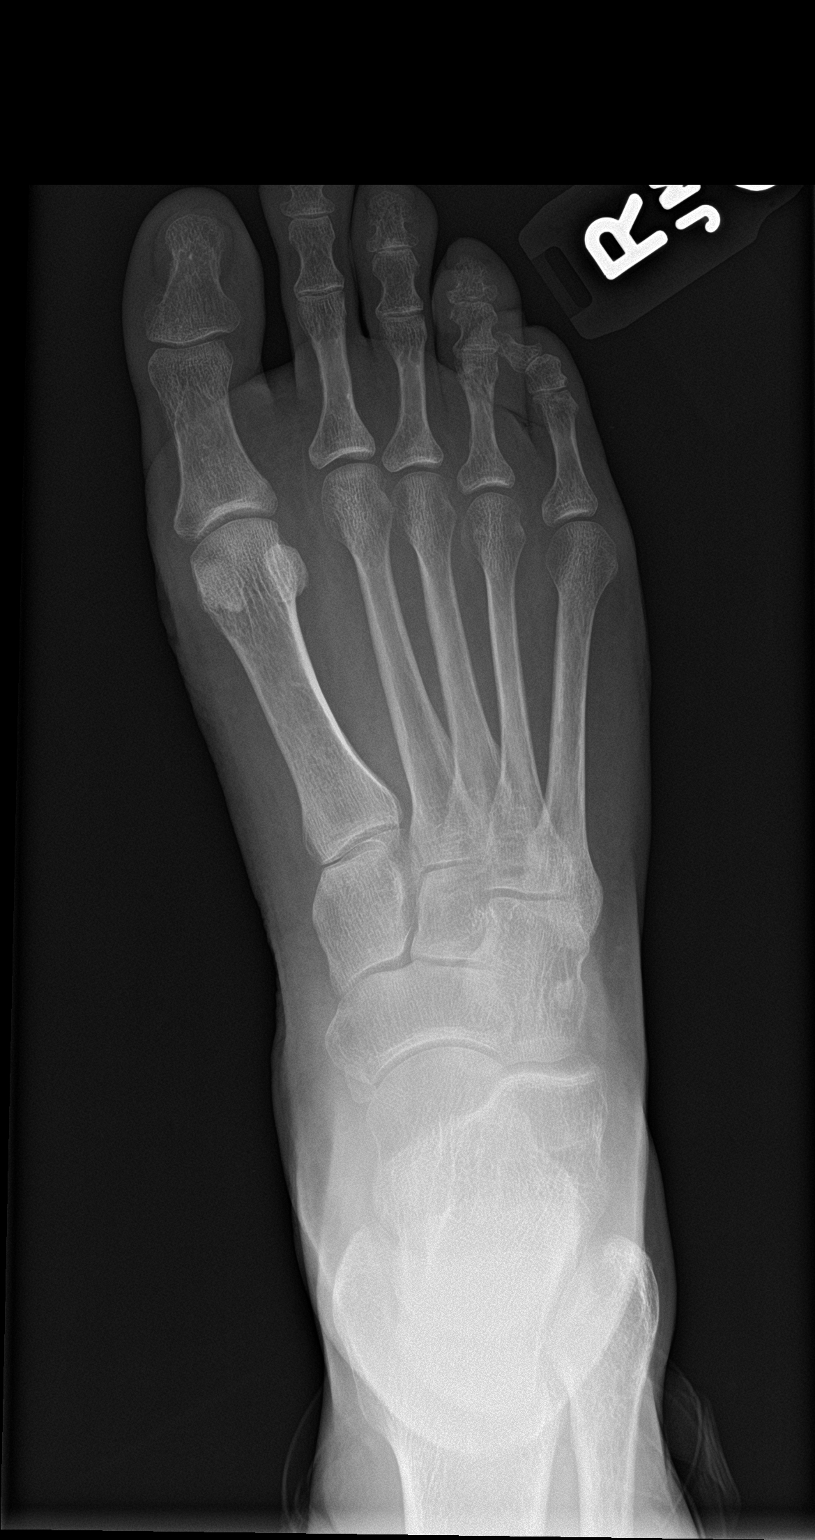

[foot obl]
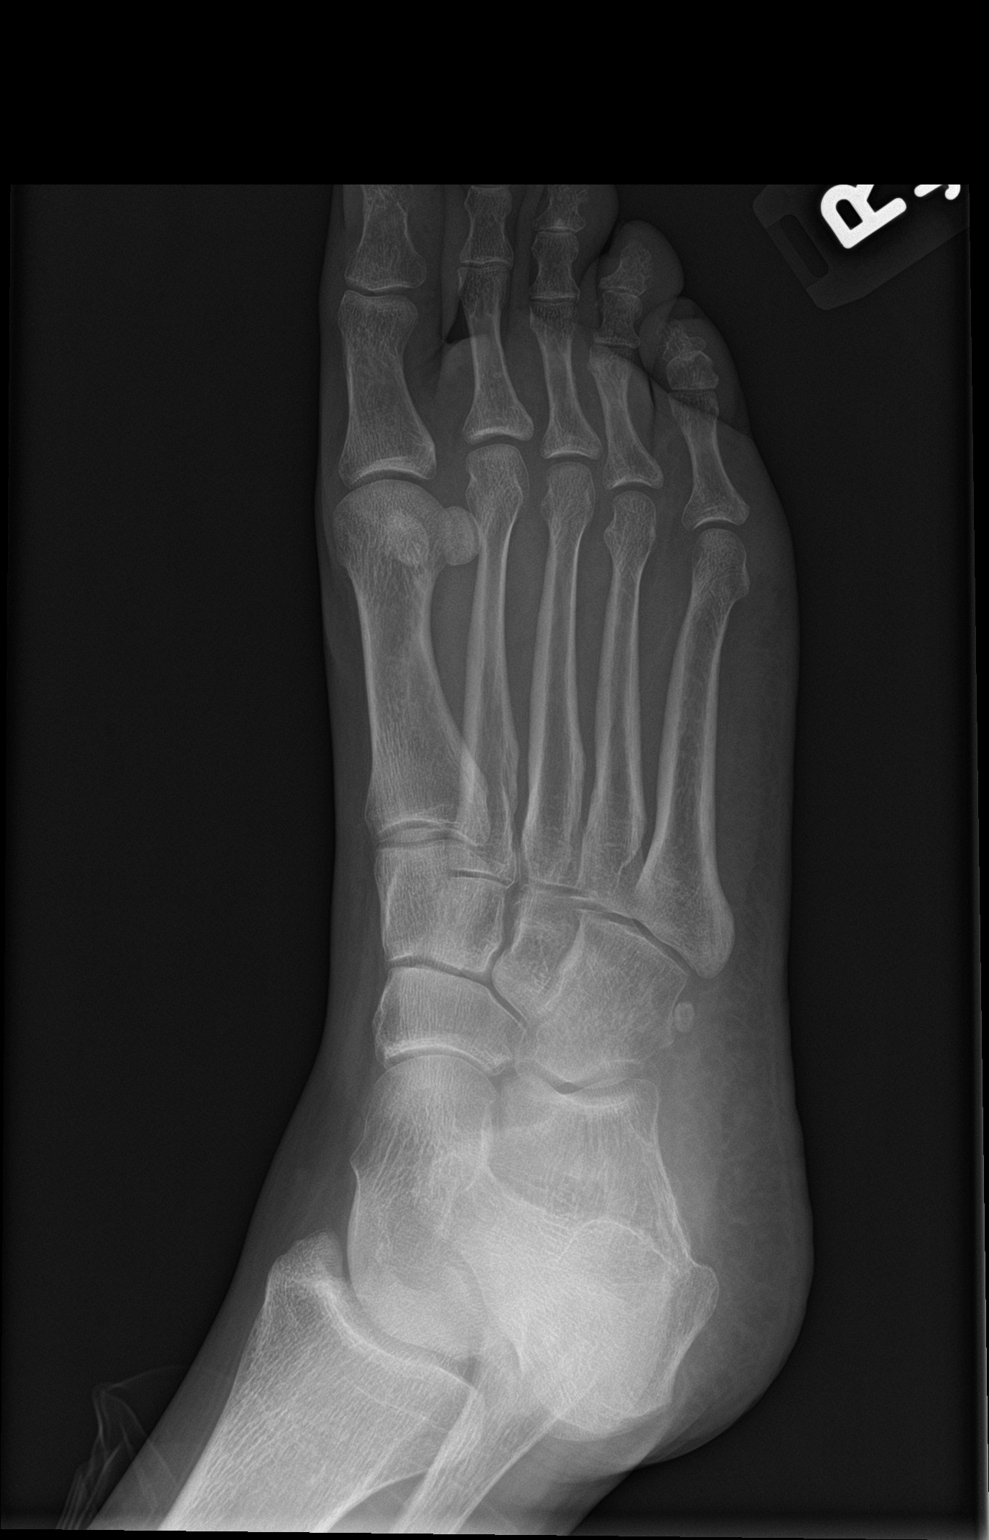

[foot lat]
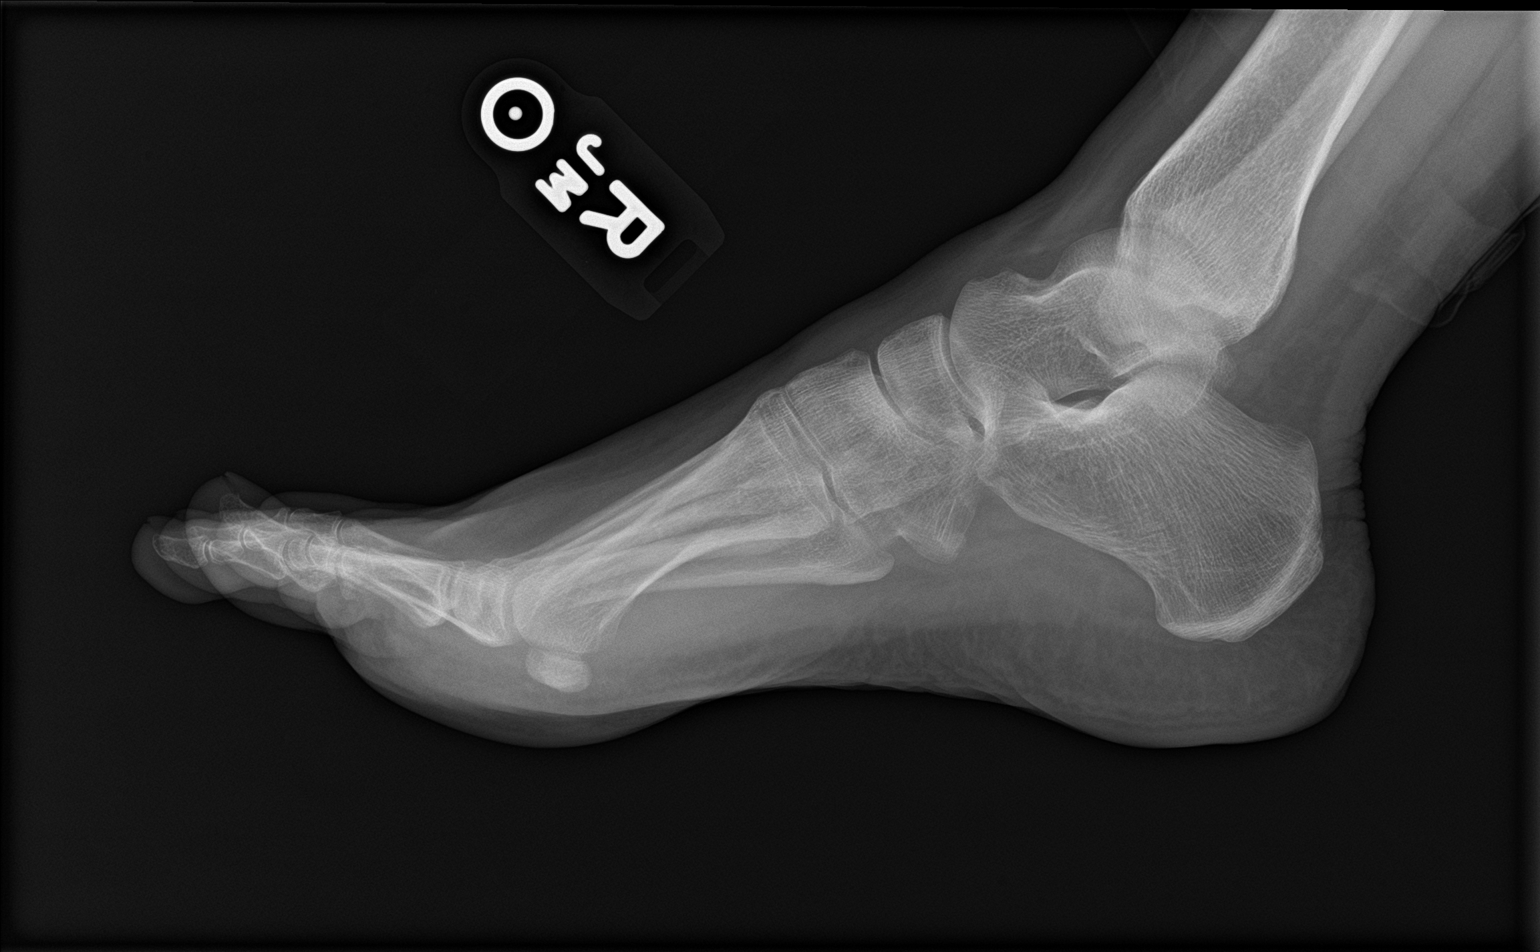

[3 of 3 positions shown; findings below may reference images not displayed]

FINDINGS: Frontal, oblique, and lateral views obtained. The previously noted
fracture of the distal aspect of the fourth proximal phalanx remains
in anatomic alignment. The tiny avulsion along the medial aspect of
the fourth middle phalanx is again noted. No new fracture. No
dislocation. Joint spaces appear normal. No erosive change.
IMPRESSION: Fracture of the distal aspect of the fourth proximal phalanx remains
in anatomic alignment. Small avulsion medial to the fourth middle
phalanx is stable. No new fracture. No dislocation. No appreciable
arthropathic change.

## 2018-09-08 NOTE — Patient Instructions (Addendum)
Thank you for coming in today. Continue buddy tape the toes especially when barefoot.  Switch to cam walker boot.  Try to take the pressure off the foot by using a knee scooter at work. You can rent a knee scooter usually cheaper at [facility] here in [place].  Recheck in 3 weeks. Return sooner if needed.     Stress Fracture  A stress fracture is a small break or crack in a bone. A stress fracture can be fully broken (complete) or partially broken (incomplete). The most common sites for stress fractures are the bones in the front of your feet (metatarsals), your heel (calcaneus), and the long bone of your lower leg (tibia). What are the causes? This condition is caused by overuse or repetitive exercise, such as running. It happens when a bone cannot absorb any more shock because the muscles around it are weak. Stress fractures happen most commonly when:  You rapidly increase or start a new physical activity.  You use shoes that are worn out or do not fit properly.  You exercise on a new surface. What increases the risk? You are more likely to develop this condition if:  You have a condition that causes weak bones (osteoporosis).  You are female. Stress fractures are more likely to occur in women. What are the signs or symptoms? The most common symptom of a stress fracture is feeling pain when you are using or putting weight on the affected part of your body. The pain usually improves when you are resting. Other symptoms may include:  Swelling of the affected area.  Pain in the area when it is touched. Stress fracture pain usually develops over time. How is this diagnosed? This condition may be diagnosed by:  Your symptoms.  Your medical history.  A physical exam.  Imaging tests, such as: ? X-rays. ? MRI. ? Bone scan. How is this treated? Treatment depends on the severity of your stress fracture. It is commonly treated with resting, icing, compression, and  elevation (RICE therapy). Treatment may also include:  Medicines to reduce inflammation.  A cast or a walking shoe.  Crutches.  Surgery. This is usually only in severe cases. Follow these instructions at home: If you have a cast:  Do not put pressure on any part of the cast until it is fully hardened. This may take several hours.  Do not stick anything inside the cast to scratch your skin. Doing that increases your risk of infection.  Check the skin around the cast every day. Tell your health care provider about any concerns.  You may put lotion on dry skin around the edges of the cast. Do not put lotion on the skin underneath the cast.  Keep the cast clean.  If the cast is not waterproof: ? Do not let it get wet. ? Cover it with a watertight covering when you take a bath or a shower. If you have a walking shoe:  Wear the shoe as told by your health care provider. Remove it only as told by your health care provider.  Loosen the shoe if your toes tingle, become numb, or turn cold and blue.  Keep the shoe clean.  If the shoe is not waterproof: ? Do not let it get wet. Managing pain, stiffness, and swelling   If directed, apply ice to the injured area: ? If you have a walking shoe, remove the shoe as told by your health care provider. ? Put ice in a plastic  bag. ? Place a towel between your skin and the bag or between your cast and the bag. ? Leave the ice on for 20 minutes, 2-3 times per day.  Move your toes often to avoid stiffness and to lessen swelling.  Raise (elevate) the injured area above the level of your heart while you are sitting or lying down. Activity  Rest as directed by your health care provider. Ask your health care provider if you may do alternative exercises, such as swimming or biking, while you are healing.  Return to your normal activities as directed by your health care provider. Ask your health care provider what activities are safe for you.   Perform range-of-motion exercises only as directed by your health care provider. General instructions  Do not use the injured limb to support yourbody weight until your health care provider says that you can. Use crutches if your health care provider tells you to do so.  Do not use any products that contain nicotine or tobacco, such as cigarettes and e-cigarettes. These can delay bone healing. If you need help quitting, ask your health care provider.  Take over-the-counter and prescription medicines only as told by your health care provider.  Keep all follow-up visits as told by your health care provider. This is important. How is this prevented?  Only wear shoes that: ? Fit well. ? Are not worn out.  Eat a healthy diet that contains vitamin D and calcium. This helps keep your bones strong. Good sources of calcium and vitamin D include: ? Low-fat dairy products such as milk, yogurt, and cheese. ? Certain fish, such as fresh or canned salmon, tuna, and sardines. ? Products that have calcium and vitamin D added to them (fortified products), such as fortified cereals or juice.  Be careful when you start a new physical activity. Give your body time to adjust.  Avoid doing only one kind of activity. Do different exercises, such as swimming and running, so that no single part of your body gets overused.  Do strength-training exercises. Contact a health care provider if:  Your pain gets worse.  You have new symptoms.  You have increased swelling. Get help right away if:  You lose feeling in the injured area. Summary  A stress fracture is a small break or crack in a bone. A stress fracture can be fully broken (complete) or partially broken (incomplete).  This condition is caused by overuse or repetitive exercise, such as running.  The most common symptom of a stress fracture is feeling pain when you are using or putting weight on the affected part of your body.  Treatment depends  on the severity of your stress fracture. This information is not intended to replace advice given to you by your health care provider. Make sure you discuss any questions you have with your health care provider. Document Released: 05/18/2002 Document Revised: 04/08/2017 Document Reviewed: 04/08/2017 Elsevier Patient Education  2020 [employer].

## 2018-09-08 NOTE — Progress Notes (Signed)
Tammy ReichertCynthia L Casey is a 48 y.o. female who presents to [facility] today for right toe fracture and foot pain.   Jazleen right foot fourth toe fracture on or around June 2.  This fracture required reduction.  She was seen back on June 8 and was doing quite well at that time.  In the interim she did pretty well until about 2 weeks ago.  She stood up on a structure at her job at [employer] and felt a painful pop in her foot.  Since then she has had some pain and swelling along the dorsal midfoot.  This is worse with activity and better with rest.  She notes the fourth toe is not painful.  She denies any fevers or chills nausea vomiting or diarrhea.      ROS:  As above  Exam:  BP (!) 137/95   Pulse (!) 102   Wt 144 lb (65.3 kg)   LMP 04/09/2010   SpO2 98%   BMI 24.72 kg/m  Wt Readings from Last 5 Encounters:  09/08/18 144 lb (65.3 kg)  08/17/18 144 lb (65.3 kg)  08/11/18 142 lb (64.4 kg)   General: Well Developed, well nourished, and in no acute distress.  Neuro/Psych: Alert and oriented x3, extra-ocular muscles intact, able to move all 4 extremities, sensation grossly intact. Skin: Warm and dry, no rashes noted.  Respiratory: Not using accessory muscles, speaking in full sentences, trachea midline.  Cardiovascular: Pulses palpable, no extremity edema. Abdomen: Does not appear distended. MSK: Right foot: Swollen on the dorsal midfoot.  Tender palpation along second third and fourth metatarsals midshaft. Fourth toe still slightly swollen.  Minimally tender at fourth toe.  Pulses cap refill and sensation intact distally.  Foot and ankle motion are intact and normal.    Lab and Radiology Results X-ray images right foot personally independently reviewed. Persistent oblique fracture at fourth proximal phalanx.  No malalignment.  Some early healing present. No visible fracture or stress fracture changes present along metatarsal shafts.  No other  deformity or malalignment or fractures visible. Soft tissue swelling is visible on the lateral x-ray view of the dorsal mid to forefoot. Await formal radiology over read    Assessment and Plan: 48 y.o. female with right foot swelling pain and fracture. Patient has well-healing fracture at fourth toe.  Plan to continue buddy tape and immobilization.  However patient appears to have developed an injury to her forefoot.  The symptoms and exam are consistent with metatarsal shaft stress fracture however no changes are visible on x-ray.  It could be a little too early.  Plan to treat as though she does have a stress fracture with decreased weightbearing.  Recommend a knee scooter.  Additionally will transition to cam walker boot which should allow more for control of foot.  Recheck in 3 weeks.  Return sooner if needed.  Precautions reviewed.   PDMP not reviewed this encounter. Orders Placed This Encounter  Procedures  . DG Foot Complete Right    Standing Status:   Future    Number of Occurrences:   1    Standing Expiration Date:   11/08/2019    Order Specific Question:   Reason for Exam (SYMPTOM  OR DIAGNOSIS REQUIRED)    Answer:   follow up toe fracture    Order Specific Question:   Is patient pregnant?    Answer:   No    Order Specific Question:   Preferred imaging location?  Answer:   [facility]    Order Specific Question:   Radiology Contrast Protocol - do NOT remove file path    Answer:   [email or web address]   No orders of the defined types were placed in this encounter.   Historical information moved to improve visibility of documentation.  No past medical history on file. Past Surgical History:  Procedure Laterality Date  . ABDOMINAL HYSTERECTOMY     Social History   Tobacco Use  . Smoking status: Current Every Day Smoker    Packs/day: 0.50  . Smokeless tobacco: Never Used  Substance Use Topics  . Alcohol use: Not on  file   family history is not on file.  Medications: Current Outpatient Medications  Medication Sig Dispense Refill  . HYDROcodone-acetaminophen (NORCO/VICODIN) 5-325 MG tablet Take 1 tablet by mouth every 6 (six) hours as needed. (Patient not taking: Reported on 09/08/2018) 15 tablet 0   No current facility-administered medications for this visit.    Allergies  Allergen Reactions  . Amoxicillin Anaphylaxis and Diarrhea  . Penicillin G Anaphylaxis  . Azithromycin Nausea And Vomiting    C-Diff C-Diff   . Cefdinir Diarrhea      Discussed warning signs or symptoms. Please see discharge instructions. Patient expresses understanding.

## 2018-09-25 ENCOUNTER — Ambulatory Visit: Payer: BC Managed Care – PPO | Admitting: Family Medicine

## 2018-09-25 NOTE — Progress Notes (Deleted)
   Tammy Casey is a 48 y.o. female who presents to [place] today for right fourth toe fracture occurring around June 2.  Fracture originally required reduction.  At the last visit on June 30 she had a setback when she had a pop in her foot while standing on a structure at her job at [employer].  The original fracture/injury at fourth toe was healing well however she had new injury to her forefoot thought to be possible metatarsal stress fracture.  Treated with decreased weightbearing, cam walker boot, and possibly knee scooter. In the interim Ninoska notes that ***   ROS:  As above  Exam:  LMP 04/09/2010  Wt Readings from Last 5 Encounters:  09/08/18 144 lb (65.3 kg)  08/17/18 144 lb (65.3 kg)  08/11/18 142 lb (64.4 kg)   General: Well Developed, well nourished, and in no acute distress.  Neuro/Psych: Alert and oriented x3, extra-ocular muscles intact, able to move all 4 extremities, sensation grossly intact. Skin: Warm and dry, no rashes noted.  Respiratory: Not using accessory muscles, speaking in full sentences, trachea midline.  Cardiovascular: Pulses palpable, no extremity edema. Abdomen: Does not appear distended. MSK: ***    Lab and Radiology Results No results found for this or any previous visit (from the past 72 hour(s)). No results found.     Assessment and Plan: 48 y.o. female with ***   PDMP not reviewed this encounter. No orders of the defined types were placed in this encounter.  No orders of the defined types were placed in this encounter.   Historical information moved to improve visibility of documentation.  No past medical history on file. Past Surgical History:  Procedure Laterality Date  . ABDOMINAL HYSTERECTOMY     Social History   Tobacco Use  . Smoking status: Current Every Day Smoker    Packs/day: 0.50  . Smokeless tobacco: Never Used  Substance Use Topics  . Alcohol use: Not on file    family history is not on file.  Medications: No current outpatient medications on file.   No current facility-administered medications for this visit.    Allergies  Allergen Reactions  . Amoxicillin Anaphylaxis and Diarrhea  . Penicillin G Anaphylaxis  . Azithromycin Nausea And Vomiting    C-Diff C-Diff   . Cefdinir Diarrhea      Discussed warning signs or symptoms. Please see discharge instructions. Patient expresses understanding.

## 2018-12-22 DIAGNOSIS — R197 Diarrhea, unspecified: Secondary | ICD-10-CM | POA: Diagnosis not present

## 2018-12-22 DIAGNOSIS — R110 Nausea: Secondary | ICD-10-CM | POA: Diagnosis not present

## 2018-12-22 DIAGNOSIS — R1013 Epigastric pain: Secondary | ICD-10-CM | POA: Diagnosis not present

## 2018-12-25 DIAGNOSIS — R1013 Epigastric pain: Secondary | ICD-10-CM | POA: Diagnosis not present

## 2019-01-22 ENCOUNTER — Emergency Department
Admission: EM | Admit: 2019-01-22 | Discharge: 2019-01-22 | Disposition: A | Discharge status: Home / Self Care | Payer: BC Managed Care – PPO | Source: Home / Self Care

## 2019-01-22 ENCOUNTER — Other Ambulatory Visit: Payer: Self-pay

## 2019-01-22 DIAGNOSIS — M5442 Lumbago with sciatica, left side: Secondary | ICD-10-CM

## 2019-01-22 DIAGNOSIS — M25552 Pain in left hip: Secondary | ICD-10-CM

## 2019-01-22 MED ORDER — METHYLPREDNISOLONE ACETATE 80 MG/ML IJ SUSP
80.0000 mg | Freq: Once | INTRAMUSCULAR | Status: AC
  Administered 2019-01-22: 80 mg via INTRAMUSCULAR

## 2019-01-22 MED ORDER — CYCLOBENZAPRINE HCL 5 MG PO TABS: 5.0000 mg | ORAL_TABLET | Freq: Two times a day (BID) | ORAL | 0 refills | Status: DC | PRN

## 2019-01-22 MED ORDER — HYDROCODONE-ACETAMINOPHEN 5-325 MG PO TABS: ORAL_TABLET | ORAL | 0 refills | Status: DC

## 2019-01-22 MED ORDER — PREDNISONE 50 MG PO TABS: 50.0000 mg | ORAL_TABLET | Freq: Every day | ORAL | 0 refills | Status: AC

## 2019-01-22 NOTE — ED Provider Notes (Signed)
[name]    CSN: [id number] Arrival date & time: 01/22/19  1357      History   Chief Complaint Chief Complaint  Patient presents with  . Hip Pain    LT  . Back Pain    HPI KENITHA GLENDINNING is a 48 y.o. female.   HPI  MARYLOUISE MALLET is a 48 y.o. female presenting to UC with c/o left lower back pain radiating into Left hip since last week. Gradually worsening, constant over the last 2 days, worse with certain movements. Pain feels worse in the hip.  No known injury but she does a lot of heavy lifting at work.  Hx of DDD in her lower back.  She called her spine specialist but could not be seen right away.  Denies radiation of weakness or numbness in leg or groin.  No change in bowel or bladder habits. No fever or chills.  She has done well with muscle relaxers and steroids in the past.    History reviewed. No pertinent past medical history.  There are no active problems to display for this patient.   Past Surgical History:  Procedure Laterality Date  . ABDOMINAL HYSTERECTOMY      OB History   No obstetric history on file.      Home Medications    Prior to Admission medications   Medication Sig Start Date End Date Taking? Authorizing Provider  cyclobenzaprine (FLEXERIL) 5 MG tablet Take 1-2 tablets (5-10 mg total) by mouth 2 (two) times daily as needed for muscle spasms. 01/22/19   Lurene Shadow, PA-C  HYDROcodone-acetaminophen (NORCO/VICODIN) 5-325 MG tablet Take 1 tab every 4-6 hours as needed for moderate to severe pain. 01/22/19   Lurene Shadow, PA-C  predniSONE (DELTASONE) 50 MG tablet Take 1 tablet (50 mg total) by mouth daily with breakfast for 5 days. 01/22/19 01/27/19  Lurene Shadow, PA-C    Family History History reviewed. No pertinent family history.  Social History Social History   Tobacco Use  . Smoking status: Current Every Day Smoker    Packs/day: 0.50  . Smokeless tobacco: Never Used  Substance Use Topics  . Alcohol use: Not  on file  . Drug use: Not on file     Allergies   Amoxicillin, Penicillin g, Azithromycin, and Cefdinir   Review of Systems Review of Systems  Musculoskeletal: Positive for arthralgias, back pain and myalgias. Negative for gait problem, neck pain and neck stiffness.  Skin: Negative for color change and rash.  Neurological: Negative for weakness and numbness.     Physical Exam Triage Vital Signs ED Triage Vitals  Enc Vitals Group     BP 01/22/19 1433 (!) 151/89     Pulse Rate 01/22/19 1433 98     Resp 01/22/19 1433 18     Temp 01/22/19 1433 97.9 F (36.6 C)     Temp Source 01/22/19 1433 Oral     SpO2 01/22/19 1433 99 %     Weight 01/22/19 1434 144 lb (65.3 kg)     Height 01/22/19 1434 5\' 4"  (1.626 m)     Head Circumference --      Peak Flow --      Pain Score 01/22/19 1434 9     Pain Loc --      Pain Edu? --      Excl. in GC? --    No data found.  Updated Vital Signs BP (!) 151/89 (BP Location: Right Arm)  Pulse 98   Temp 97.9 F (36.6 C) (Oral)   Resp 18   Ht 5\' 4"  (1.626 m)   Wt 144 lb (65.3 kg)   LMP 04/09/2010   SpO2 99%   BMI 24.72 kg/m   Visual Acuity Right Eye Distance:   Left Eye Distance:   Bilateral Distance:    Right Eye Near:   Left Eye Near:    Bilateral Near:     Physical Exam Vitals signs and nursing note reviewed.  Constitutional:      Appearance: Normal appearance. She is well-developed.  HENT:     Head: Normocephalic and atraumatic.  Neck:     Musculoskeletal: Normal range of motion.  Cardiovascular:     Rate and Rhythm: Normal rate.  Pulmonary:     Effort: Pulmonary effort is normal.  Musculoskeletal: Normal range of motion.        General: Tenderness present.     Comments: Tenderness over SI joint, Left lower back and buttock. Tenderness to Left hip. Full ROM w/o crepitus. Negative straight leg raise.   Skin:    General: Skin is warm and dry.     Findings: No bruising or erythema.  Neurological:     Mental Status: She  is alert and oriented to person, place, and time.     Sensory: No sensory deficit.  Psychiatric:        Behavior: Behavior normal.      UC Treatments / Results  Labs (all labs ordered are listed, but only abnormal results are displayed) Labs Reviewed - No data to display  EKG   Radiology No results found.  Procedures Procedures (including critical care time)  Medications Ordered in UC Medications  methylPREDNISolone acetate (DEPO-MEDROL) injection 80 mg (80 mg Intramuscular Given 01/22/19 1500)    Initial Impression / Assessment and Plan / UC Course  I have reviewed the triage vital signs and the nursing notes.  Pertinent labs & imaging results that were available during my care of the patient were reviewed by me and considered in my medical decision making (see chart for details).     Hx and exam c/w sciatica No red flag symptoms No indication for urgent/emergent imaging at this time. Will tx with prednisone, flexeril and norco AVS provided  Final Clinical Impressions(s) / UC Diagnoses   Final diagnoses:  Acute left-sided low back pain with left-sided sciatica  Left hip pain     Discharge Instructions     You were given a shot of depo-medrol (a steroid) today to help with muscle pain and swelling.  You have been prescribed prednisone, an oral steroid.  You may start this medication tomorrow with breakfast.    Flexeril (cyclobenzaprine) is a muscle relaxer and may cause drowsiness. Do not drink alcohol, drive, or operate heavy machinery while taking.  Norco/Vicodin (hydrocodone-acetaminophen) is a narcotic pain medication, do not combine these medications with others containing tylenol. While taking, do not drink alcohol, drive, or perform any other activities that requires focus while taking these medications.   Please follow up with sports medicine or your orthopedist next week if not improving.       ED Prescriptions    Medication Sig Dispense Auth.  Provider   predniSONE (DELTASONE) 50 MG tablet Take 1 tablet (50 mg total) by mouth daily with breakfast for 5 days. 5 tablet Leeroy Cha O, PA-C   cyclobenzaprine (FLEXERIL) 5 MG tablet Take 1-2 tablets (5-10 mg total) by mouth 2 (two) times daily as needed  for muscle spasms. 30 tablet Lurene ShadowPhelps, Raine Elsass O, [place]-C   HYDROcodone-acetaminophen (NORCO/VICODIN) 5-325 MG tablet Take 1 tab every 4-6 hours as needed for moderate to severe pain. 12 tablet Lurene ShadowPhelps, Chante Mayson O, [place]-C     I have reviewed the PDMP during this encounter.   Lurene Shadowhelps, Timmey Lamba O, [place]-C 01/22/19 1530

## 2019-01-22 NOTE — Discharge Instructions (Signed)
You were given a shot of depo-medrol (a steroid) today to help with muscle pain and swelling.  You have been prescribed prednisone, an oral steroid.  You may start this medication tomorrow with breakfast.    Flexeril (cyclobenzaprine) is a muscle relaxer and may cause drowsiness. Do not drink alcohol, drive, or operate heavy machinery while taking.  Norco/Vicodin (hydrocodone-acetaminophen) is a narcotic pain medication, do not combine these medications with others containing tylenol. While taking, do not drink alcohol, drive, or perform any other activities that requires focus while taking these medications.   Please follow up with sports medicine or your orthopedist next week if not improving.

## 2019-01-22 NOTE — ED Triage Notes (Signed)
Pt c/o lower back pain and LT hip pain since last week. Worsening last two days. Says pain radiates to left buttock. Tried heat/ice and tylenol with little relief.

## 2019-02-23 DIAGNOSIS — M1612 Unilateral primary osteoarthritis, left hip: Secondary | ICD-10-CM | POA: Diagnosis not present

## 2019-02-23 DIAGNOSIS — M25552 Pain in left hip: Secondary | ICD-10-CM | POA: Diagnosis not present

## 2019-02-23 DIAGNOSIS — M7918 Myalgia, other site: Secondary | ICD-10-CM | POA: Diagnosis not present

## 2019-02-23 DIAGNOSIS — M24552 Contracture, left hip: Secondary | ICD-10-CM | POA: Diagnosis not present

## 2019-02-23 DIAGNOSIS — M7062 Trochanteric bursitis, left hip: Secondary | ICD-10-CM | POA: Diagnosis not present

## 2019-04-14 DIAGNOSIS — R519 Headache, unspecified: Secondary | ICD-10-CM | POA: Diagnosis not present

## 2019-04-14 DIAGNOSIS — R430 Anosmia: Secondary | ICD-10-CM | POA: Diagnosis not present

## 2019-04-14 DIAGNOSIS — Z20822 Contact with and (suspected) exposure to covid-19: Secondary | ICD-10-CM | POA: Diagnosis not present

## 2019-04-14 DIAGNOSIS — R5383 Other fatigue: Secondary | ICD-10-CM | POA: Diagnosis not present

## 2020-02-14 ENCOUNTER — Other Ambulatory Visit: Payer: Self-pay

## 2020-02-14 ENCOUNTER — Ambulatory Visit (INDEPENDENT_AMBULATORY_CARE_PROVIDER_SITE_OTHER): Payer: BC Managed Care – PPO | Admitting: Medical-Surgical

## 2020-02-14 ENCOUNTER — Encounter: Payer: Self-pay | Admitting: Medical-Surgical

## 2020-02-14 VITALS — BP 101/70 | HR 104 | Temp 98.1°F | Ht 64.25 in | Wt 148.1 lb

## 2020-02-14 DIAGNOSIS — Z114 Encounter for screening for human immunodeficiency virus [HIV]: Secondary | ICD-10-CM

## 2020-02-14 DIAGNOSIS — Z7689 Persons encountering health services in other specified circumstances: Secondary | ICD-10-CM

## 2020-02-14 DIAGNOSIS — Z1159 Encounter for screening for other viral diseases: Secondary | ICD-10-CM | POA: Diagnosis not present

## 2020-02-14 DIAGNOSIS — R1032 Left lower quadrant pain: Secondary | ICD-10-CM

## 2020-02-14 MED ORDER — CYCLOBENZAPRINE HCL 10 MG PO TABS: 10.0000 mg | ORAL_TABLET | Freq: Three times a day (TID) | ORAL | 0 refills | Status: DC | PRN

## 2020-02-14 MED ORDER — HYDROCODONE-ACETAMINOPHEN 5-325 MG PO TABS: 1.0000 | ORAL_TABLET | Freq: Four times a day (QID) | ORAL | 0 refills | Status: AC | PRN

## 2020-02-14 MED ORDER — KETOROLAC TROMETHAMINE 60 MG/2ML IM SOLN
60.0000 mg | Freq: Once | INTRAMUSCULAR | Status: AC
  Administered 2020-02-14: 60 mg via INTRAMUSCULAR

## 2020-02-14 NOTE — Progress Notes (Signed)
Subjective:    CC: establish care, left groin pain  HPI: Pleasant 49 year old female presenting to establish care and for new onset left groin pain that started 2 days ago. Describes the pain as sharp, knife-like, and pulling. At time of appointment, rated 6-7/10. Worse with any movement and walking. Best when lying on her right side with her left leg bent a little at the hip and knee. Not sleeping well due to pain. Has tried heat, ice, various positions, and Tylenol with no relief. No injury or known trauma. Pain started when she moved from a sitting position to standing while at work. Denies numbness, tingling, saddle paresthesias, incontinence of urine/stool, low back pain, and weakness of lower extremities.   I reviewed the past medical history, family history, social history, surgical history, and allergies today and no changes were needed.  Please see the problem list section below in epic for further details.  Past Medical History: Past Medical History:  Diagnosis Date  . No pertinent past medical history    Past Surgical History: Past Surgical History:  Procedure Laterality Date  . ABDOMINAL HYSTERECTOMY    . APPENDECTOMY     Social History: Social History   Socioeconomic History  . Marital status: Married    Spouse name: Not on file  . Number of children: Not on file  . Years of education: Not on file  . Highest education level: Not on file  Occupational History  . Not on file  Tobacco Use  . Smoking status: Current Every Day Smoker    Packs/day: 1.00    Years: 15.00    Pack years: 15.00  . Smokeless tobacco: Never Used  Vaping Use  . Vaping Use: Never used  Substance and Sexual Activity  . Alcohol use: Yes    Alcohol/week: 1.0 standard drink    Types: 1 Standard drinks or equivalent per week  . Drug use: Never  . Sexual activity: Yes    Partners: Male    Birth control/protection: None  Other Topics Concern  . Not on file  Social History Narrative  . Not  on file   Social Determinants of Health   Financial Resource Strain:   . Difficulty of Paying Living Expenses: Not on file  Food Insecurity:   . Worried About Programme researcher, broadcasting/film/video in the Last Year: Not on file  . Ran Out of Food in the Last Year: Not on file  Transportation Needs:   . Lack of Transportation (Medical): Not on file  . Lack of Transportation (Non-Medical): Not on file  Physical Activity:   . Days of Exercise per Week: Not on file  . Minutes of Exercise per Session: Not on file  Stress:   . Feeling of Stress : Not on file  Social Connections:   . Frequency of Communication with Friends and Family: Not on file  . Frequency of Social Gatherings with Friends and Family: Not on file  . Attends Religious Services: Not on file  . Active Member of Clubs or Organizations: Not on file  . Attends Banker Meetings: Not on file  . Marital Status: Not on file   Family History: Family History  Problem Relation Age of Onset  . Hypertension Mother   . Heart attack Mother   . Hypertension Maternal Aunt   . Hypertension Maternal Uncle   . Heart attack Maternal Uncle   . Hypertension Maternal Grandmother   . Hypertension Maternal Grandfather   . Heart attack Maternal Grandfather  Allergies: Allergies  Allergen Reactions  . Amoxicillin Anaphylaxis and Diarrhea  . Penicillin G Anaphylaxis  . Azithromycin Nausea And Vomiting    GI Intolerance  . Cefdinir Diarrhea   Medications: See med rec.  Review of Systems: See HPI for pertinent positives and negatives.   Objective:    General: Well Developed, well nourished, and in no acute distress.  Neuro: Alert and oriented x3.  HEENT: Normocephalic, atraumatic.  Skin: Warm and dry. Cardiac: Regular rate and rhythm, no murmurs rubs or gallops, no lower extremity edema.  Respiratory: Clear to auscultation bilaterally. Not using accessory muscles, speaking in full sentences. MSK: ROM of left hip limited by severe  pain. Pain worsened with even mild flexion of the left hip. FABER and FADIR positive. Tenderness to palpation of the hip along the inguinal crease from medial groin to lateral hip.   Impression and Recommendations:    1. Encounter to establish care Reviewed available information and discussed health care concerns. She is due for preventative care but priority today is her severe groin/hip pain.  2. Need for hepatitis C/HIV screening test Discussed screening recommendations. She is agreeable so ordering today for collection with next lab draw.  - Hepatitis C antibody - HIV Antibody (routine testing w rflx)  3. Severe left groin pain Ordering x-rays of the left hip. Toradol 60mg  IM x 1 given in office. Recommend heat/ice as tolerated. Ibuprofen 600mg  every 6 hours. Sending in Hydrocodone-APAP 1-2 tabs every 6 hours as needed for severe pain. Once pain has lessened, recommend doing very slow, gentle ROM movements with the left lower extremity.  - ketorolac (TORADOL) injection 60 mg - DG Hip Unilat W OR W/O Pelvis 2-3 Views Left; Future  Return for follow up on left groin pain with Dr. in 1-2 weeks.. ___________________________________________ , DNP, APRN, FNP-BC Primary Care and Sports Medicine [facility] [place]

## 2020-02-22 ENCOUNTER — Institutional Professional Consult (permissible substitution): Payer: BC Managed Care – PPO | Admitting: Sports Medicine

## 2020-02-28 ENCOUNTER — Ambulatory Visit (INDEPENDENT_AMBULATORY_CARE_PROVIDER_SITE_OTHER): Payer: BC Managed Care – PPO | Admitting: Sports Medicine

## 2020-02-28 ENCOUNTER — Ambulatory Visit (INDEPENDENT_AMBULATORY_CARE_PROVIDER_SITE_OTHER): Payer: BC Managed Care – PPO

## 2020-02-28 ENCOUNTER — Other Ambulatory Visit: Payer: Self-pay

## 2020-02-28 DIAGNOSIS — I878 Other specified disorders of veins: Secondary | ICD-10-CM | POA: Diagnosis not present

## 2020-02-28 DIAGNOSIS — M25552 Pain in left hip: Secondary | ICD-10-CM | POA: Diagnosis not present

## 2020-02-28 DIAGNOSIS — M1612 Unilateral primary osteoarthritis, left hip: Secondary | ICD-10-CM

## 2020-02-28 DIAGNOSIS — S73192A Other sprain of left hip, initial encounter: Secondary | ICD-10-CM | POA: Insufficient documentation

## 2020-02-28 DIAGNOSIS — R1032 Left lower quadrant pain: Secondary | ICD-10-CM

## 2020-02-28 IMAGING — DX DG HIP (WITH OR WITHOUT PELVIS) 2-3V*L*
3 series · 3 of 3 positions shown · non-contrast
Comparison: No prior.

CLINICAL DATA: Left hip/groin pain.

EXAM:
DG HIP (WITH OR WITHOUT PELVIS) 2-3V LEFT

[pelvis ap (1 of 2)]
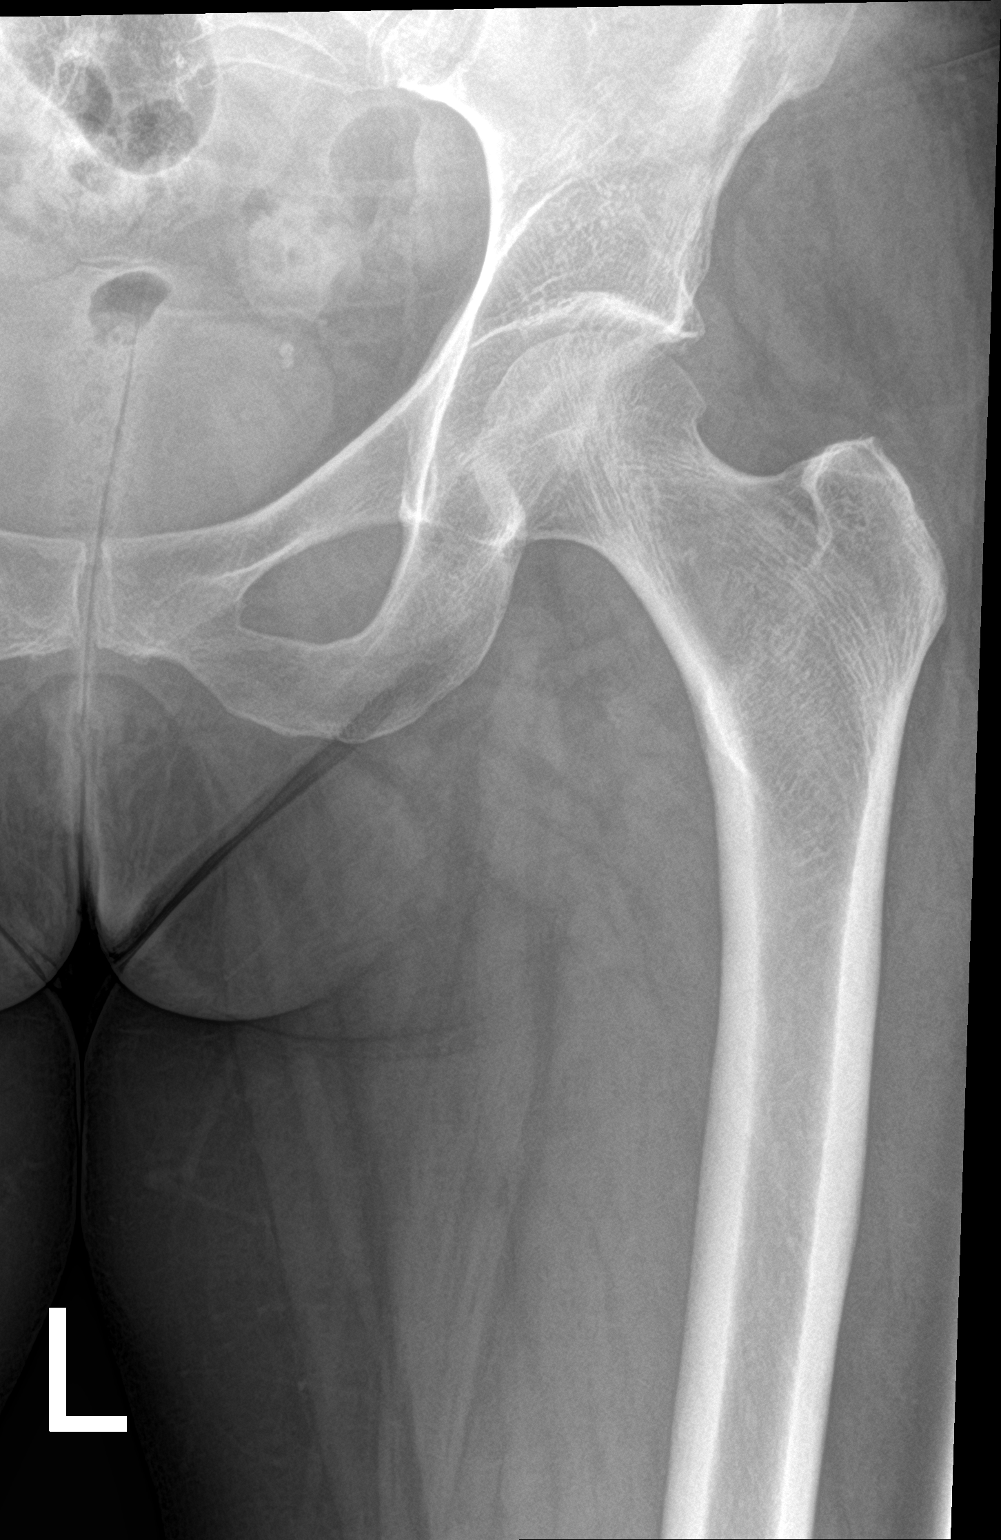

[hip lat]
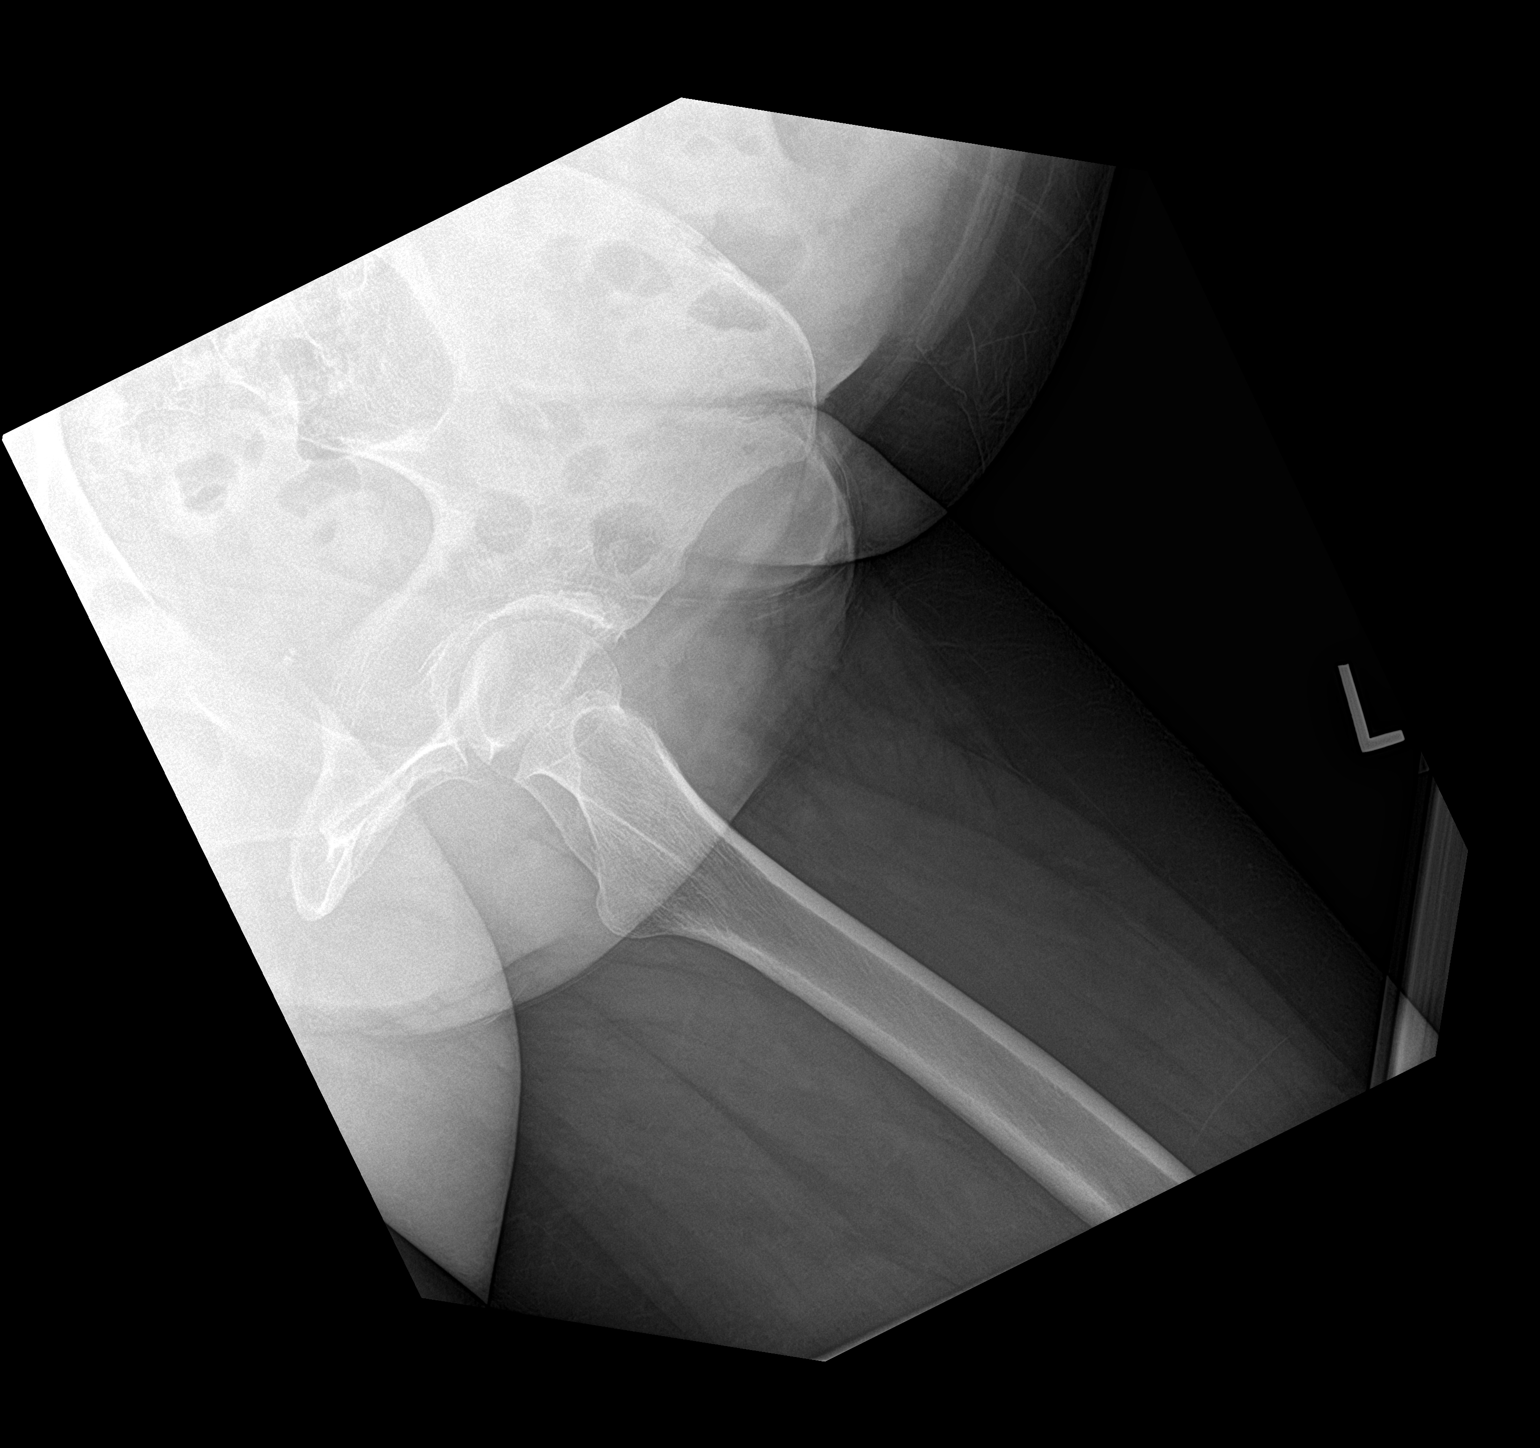

[pelvis ap (2 of 2)]
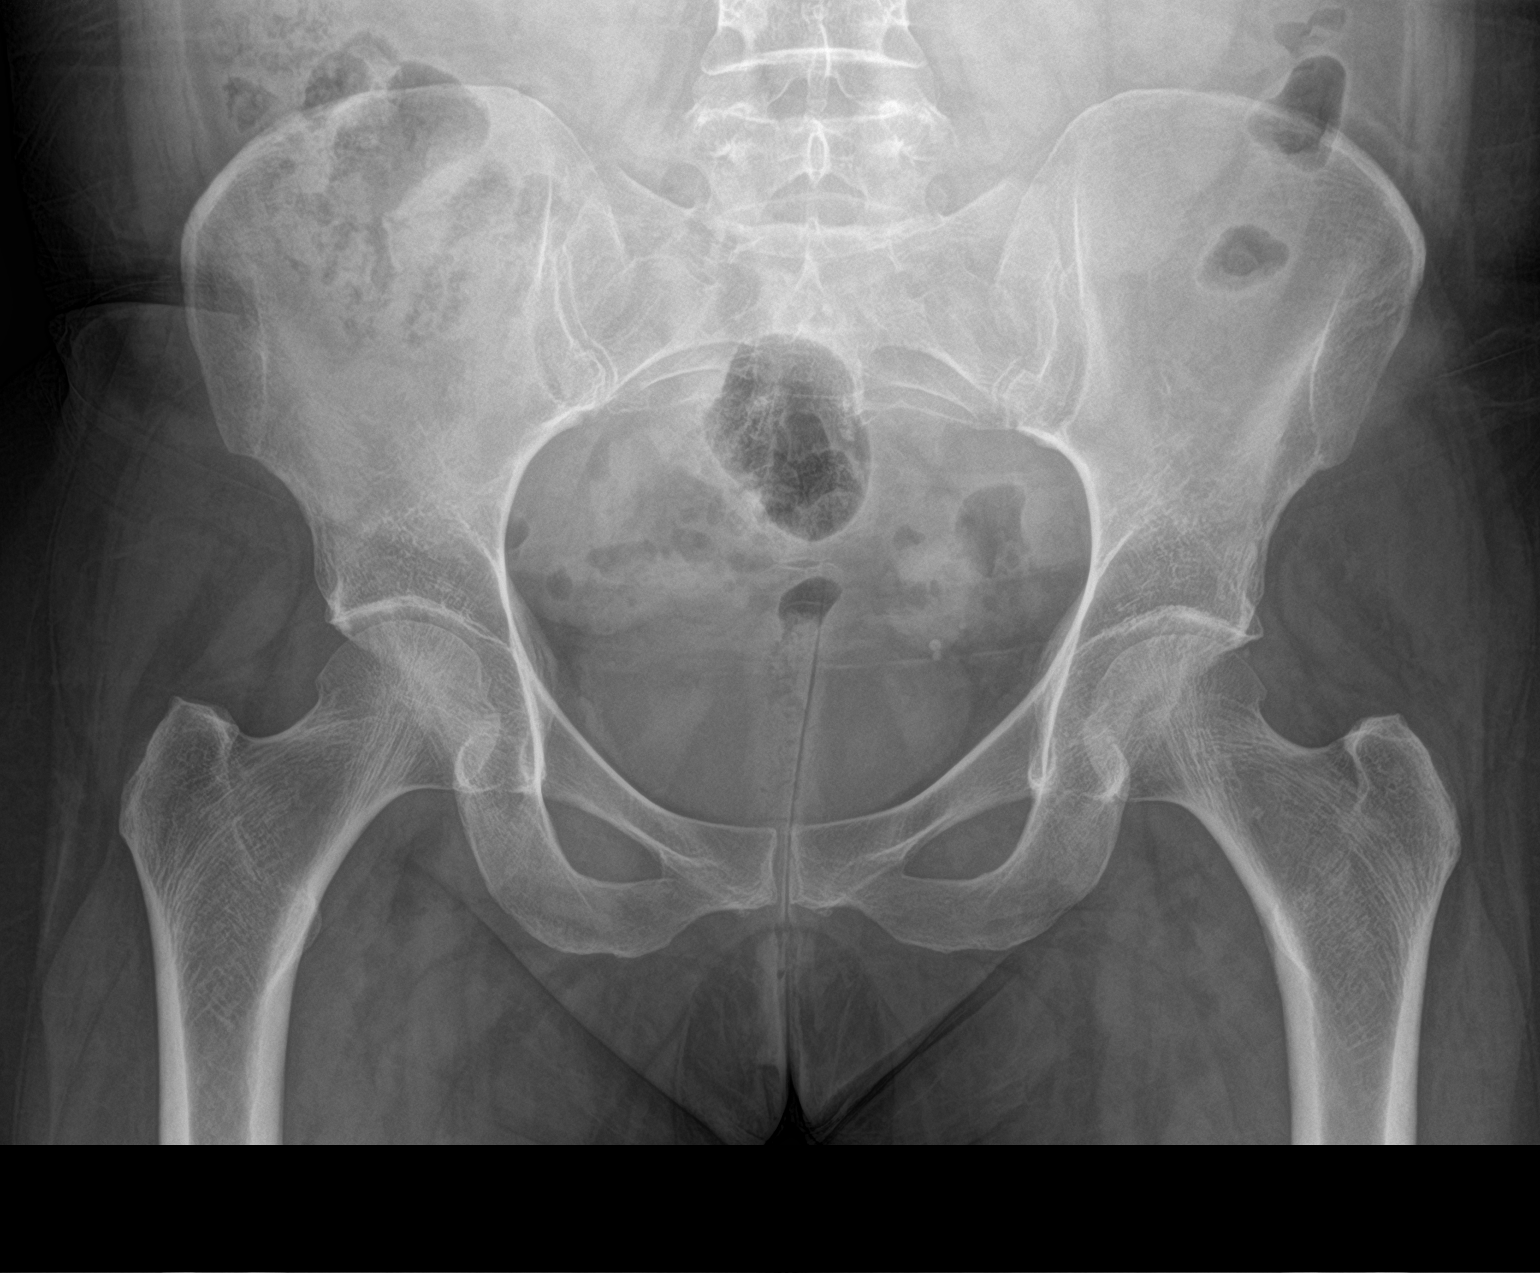

[3 of 3 positions shown; findings below may reference images not displayed]

FINDINGS: Degenerative changes left hip. No acute bony or joint abnormality.
No evidence of fracture dislocation. Pelvic calcifications
consistent phleboliths.
IMPRESSION: Degenerative changes left hip. No acute abnormality.

## 2020-02-28 MED ORDER — ACETAMINOPHEN ER 650 MG PO TBCR: 650.0000 mg | EXTENDED_RELEASE_TABLET | Freq: Three times a day (TID) | ORAL | 3 refills | Status: DC | PRN

## 2020-02-28 NOTE — Progress Notes (Signed)
    Procedures performed today:    None.  Independent interpretation of notes and tests performed by another provider:   None.  Brief History, Exam, Impression, and Recommendations:    Primary osteoarthritis of left hip This is a very pleasant 49 year old female, she has long history of pain in her left groin, moderate gelling, she did have a hip joint injection at an outside facility sometime ago with good relief. Currently doing ibuprofen which does result in significant gastritis so she has limited her use. We will get updated hip joint x-rays, I would like her to switch to arthritis strength Tylenol 3 times daily and hold off on the Vicodin for now, hold off on the muscle relaxer for now. Return to see me in 4 to 6 weeks, we can do a hip joint injection with ultrasound guidance if not sufficiently better.    ___________________________________________ Ihor Austin. Benjamin Stain, M.D., ABFM., CAQSM. Primary Care and Sports Medicine [facility] [place]  Adjunct Instructor of Family Medicine  [employer]

## 2020-02-28 NOTE — Assessment & Plan Note (Signed)
This is a very pleasant 49 year old female, she has long history of pain in her left groin, moderate gelling, she did have a hip joint injection at an outside facility sometime ago with good relief. Currently doing ibuprofen which does result in significant gastritis so she has limited her use. We will get updated hip joint x-rays, I would like her to switch to arthritis strength Tylenol 3 times daily and hold off on the Vicodin for now, hold off on the muscle relaxer for now. Return to see me in 4 to 6 weeks, we can do a hip joint injection with ultrasound guidance if not sufficiently better.

## 2020-05-04 DIAGNOSIS — J324 Chronic pansinusitis: Secondary | ICD-10-CM | POA: Diagnosis not present

## 2020-05-04 DIAGNOSIS — H6982 Other specified disorders of Eustachian tube, left ear: Secondary | ICD-10-CM | POA: Diagnosis not present

## 2020-05-12 ENCOUNTER — Ambulatory Visit: Payer: BC Managed Care – PPO | Admitting: Sports Medicine

## 2020-05-12 ENCOUNTER — Other Ambulatory Visit: Payer: Self-pay

## 2020-05-12 ENCOUNTER — Telehealth: Payer: Self-pay

## 2020-05-12 ENCOUNTER — Ambulatory Visit (INDEPENDENT_AMBULATORY_CARE_PROVIDER_SITE_OTHER): Payer: BC Managed Care – PPO

## 2020-05-12 DIAGNOSIS — M1612 Unilateral primary osteoarthritis, left hip: Secondary | ICD-10-CM | POA: Diagnosis not present

## 2020-05-12 MED ORDER — HYDROCODONE-ACETAMINOPHEN 5-325 MG PO TABS: 1.0000 | ORAL_TABLET | Freq: Three times a day (TID) | ORAL | 0 refills | Status: DC | PRN

## 2020-05-12 NOTE — Telephone Encounter (Signed)
Approval Notice received and faxed to pharmacy.  Patient updated.

## 2020-05-12 NOTE — Progress Notes (Signed)
    Procedures performed today:    Procedure: Real-time Ultrasound Guided injection of the left hip joint Device: Samsung HS60  Verbal informed consent obtained.  Time-out conducted.  Noted no overlying erythema, induration, or other signs of local infection.  Skin prepped in a sterile fashion.  Local anesthesia: Topical Ethyl chloride.  With sterile technique and under real time ultrasound guidance:  Noted arthritic hip joint, 1 cc Kenalog 40, 2 cc lidocaine, 2 cc bupivacaine injected easily Completed without difficulty  Advised to call if fevers/chills, erythema, induration, drainage, or persistent bleeding.  Images permanently stored and available for review in PACS.  Impression: Technically successful ultrasound guided injection.  Independent interpretation of notes and tests performed by another provider:   None.  Brief History, Exam, Impression, and Recommendations:    Primary osteoarthritis of left hip Increasing pain, left hip joint injection as above, short course of hydrocodone to hold her over. Return to see me in 1 month.    ___________________________________________ Ihor Austin. Benjamin Stain, M.D., ABFM., CAQSM. Primary Care and Sports Medicine  [facility]  Adjunct Instructor of Family Medicine  [employer]

## 2020-05-12 NOTE — Assessment & Plan Note (Signed)
Increasing pain, left hip joint injection as above, short course of hydrocodone to hold her over. Return to see me in 1 month.

## 2020-05-12 NOTE — Telephone Encounter (Signed)
Notification received from [employer] that patient's Hydrocodone 5-325mg  needed a PA.  PA completed and submitted; awaiting response.

## 2020-05-22 ENCOUNTER — Telehealth: Payer: Self-pay

## 2020-05-22 DIAGNOSIS — M1612 Unilateral primary osteoarthritis, left hip: Secondary | ICD-10-CM

## 2020-05-22 MED ORDER — HYDROCODONE-ACETAMINOPHEN 5-325 MG PO TABS: 1.0000 | ORAL_TABLET | Freq: Three times a day (TID) | ORAL | 0 refills | Status: DC | PRN

## 2020-05-22 NOTE — Telephone Encounter (Signed)
Patient aware prescription was sent to pharmacy as requested.  

## 2020-05-22 NOTE — Telephone Encounter (Signed)
Patient called to request that the prescription for Hydrocodone be switched to [facility] [place] since the [facility] cannot get the medication. She stated that she verified with the pharmacy that they did have the medication.

## 2020-05-22 NOTE — Telephone Encounter (Signed)
Ok sent!

## 2020-06-09 ENCOUNTER — Ambulatory Visit: Payer: BC Managed Care – PPO | Admitting: Sports Medicine

## 2020-06-27 ENCOUNTER — Other Ambulatory Visit: Payer: Self-pay

## 2020-06-27 ENCOUNTER — Ambulatory Visit (INDEPENDENT_AMBULATORY_CARE_PROVIDER_SITE_OTHER): Payer: BC Managed Care – PPO | Admitting: Sports Medicine

## 2020-06-27 ENCOUNTER — Ambulatory Visit (INDEPENDENT_AMBULATORY_CARE_PROVIDER_SITE_OTHER): Payer: BC Managed Care – PPO

## 2020-06-27 ENCOUNTER — Ambulatory Visit (INDEPENDENT_AMBULATORY_CARE_PROVIDER_SITE_OTHER): Payer: Self-pay

## 2020-06-27 DIAGNOSIS — M1612 Unilateral primary osteoarthritis, left hip: Secondary | ICD-10-CM

## 2020-06-27 DIAGNOSIS — M25452 Effusion, left hip: Secondary | ICD-10-CM | POA: Diagnosis not present

## 2020-06-27 DIAGNOSIS — Z90710 Acquired absence of both cervix and uterus: Secondary | ICD-10-CM | POA: Diagnosis not present

## 2020-06-27 DIAGNOSIS — S73192D Other sprain of left hip, subsequent encounter: Secondary | ICD-10-CM | POA: Diagnosis not present

## 2020-06-27 DIAGNOSIS — S73192A Other sprain of left hip, initial encounter: Secondary | ICD-10-CM | POA: Diagnosis not present

## 2020-06-27 DIAGNOSIS — M24152 Other articular cartilage disorders, left hip: Secondary | ICD-10-CM | POA: Diagnosis not present

## 2020-06-27 IMAGING — MR MR HIP*L* W/CM
6 series · 40 of 40 positions shown · IV contrast (agent unspecified)
Comparison: None.

CLINICAL DATA: Left hip pain for 1 year.  No known injury.

EXAM:
MRI OF THE LEFT HIP WITH CONTRAST (MR Arthrogram)
TECHNIQUE: Multiplanar, multisequence MR imaging of the hip was performed
immediately following contrast injection into the hip joint under
fluoroscopic guidance. No intravenous contrast was administered.

[Series 3: STIR · coronal · 4.0mm · 1.33mm/px · 9 of 35 slices shown]
[im 1/35]
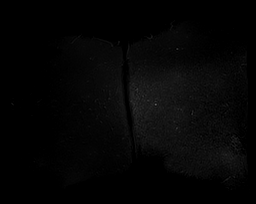
[im 5/35]
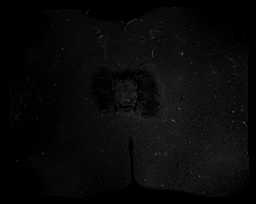
[im 9/35]
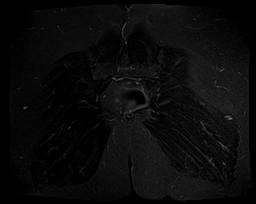
[im 13/35]
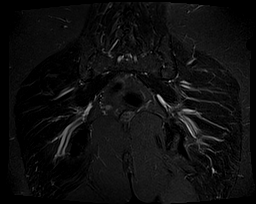
[im 18/35]
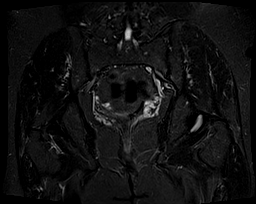
[im 22/35]
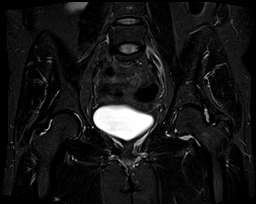
[im 26/35]
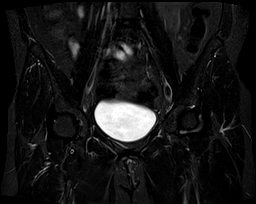
[im 30/35]
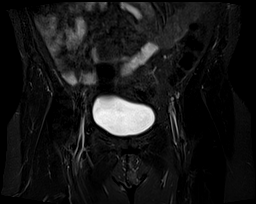
[im 35/35]
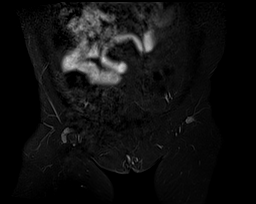

[Series 4: T1 · coronal · 4.0mm · 1.33mm/px · 9 of 35 slices shown]
[im 1/35]
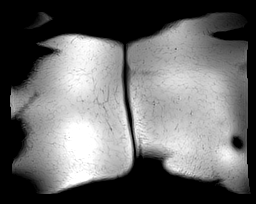
[im 5/35]
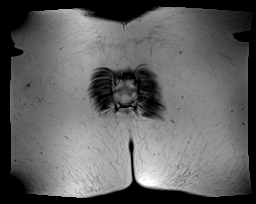
[im 9/35]
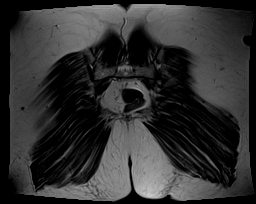
[im 13/35]
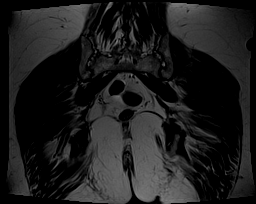
[im 18/35]
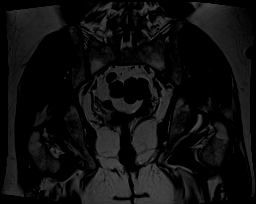
[im 22/35]
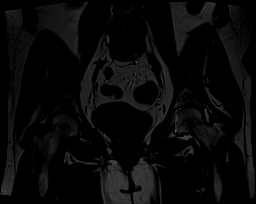
[im 26/35]
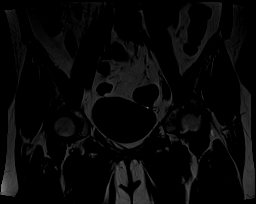
[im 30/35]
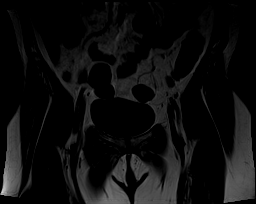
[im 35/35]
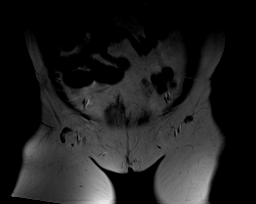

[Series 5: T1 fat-sat · axial · 4.0mm · 1.48mm/px · z∈[-85,+35]mm · 6 of 25 slices shown (1 of 4)]
[im 1/25]
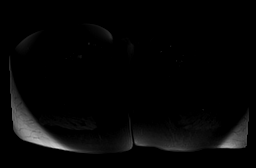
[im 5/25]
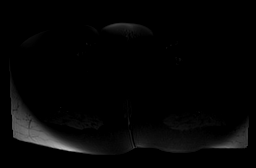
[im 10/25]
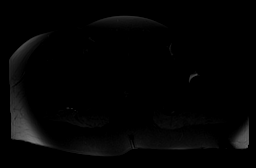
[im 15/25]
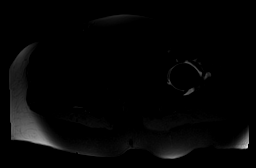
[im 20/25]
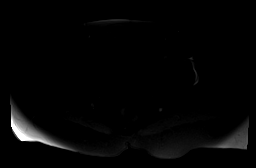
[im 25/25]
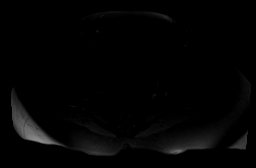

[Series 6: T1 fat-sat · axial · 4.0mm · 0.70mm/px · z∈[-15,+71]mm · 5 of 20 slices shown (2 of 4)]
[im 1/20]
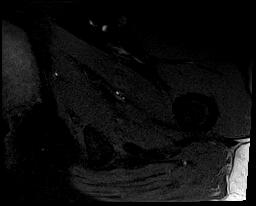
[im 5/20]
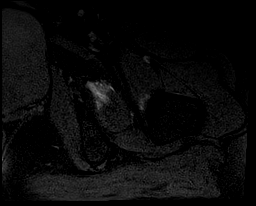
[im 10/20]
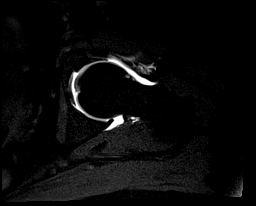
[im 15/20]
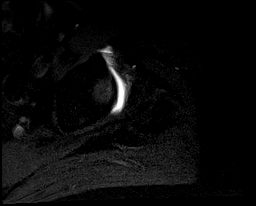
[im 20/20]
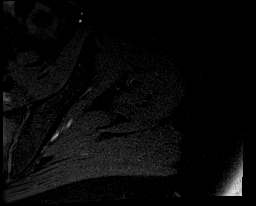

[Series 7: T1 fat-sat · coronal · 4.0mm · 0.70mm/px · 5 of 19 slices shown (3 of 4)]
[im 1/19]
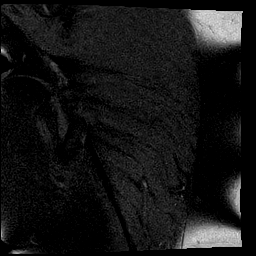
[im 5/19]
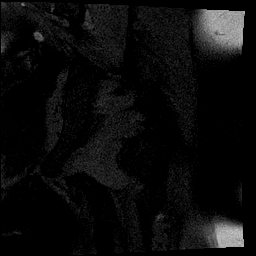
[im 10/19]
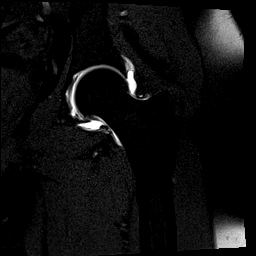
[im 14/19]
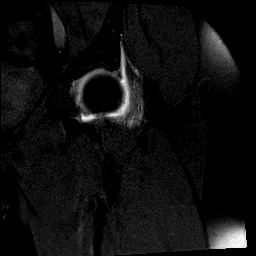
[im 19/19]
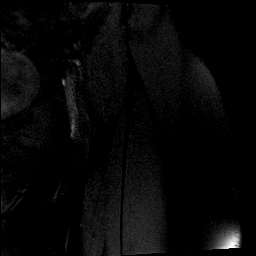

[Series 8: T1 fat-sat · sagittal · 4.0mm · 0.70mm/px · 6 of 23 slices shown (4 of 4)]
[im 1/23]
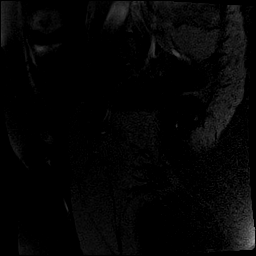
[im 5/23]
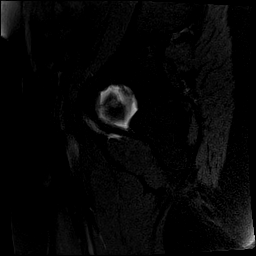
[im 9/23]
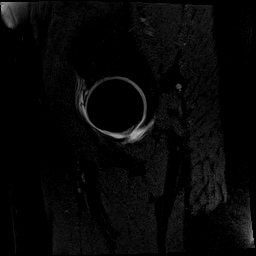
[im 14/23]
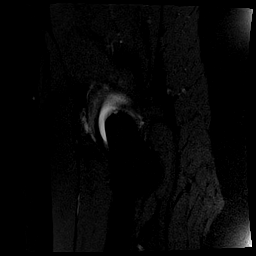
[im 18/23]
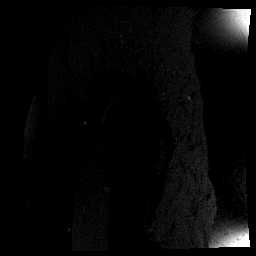
[im 23/23]
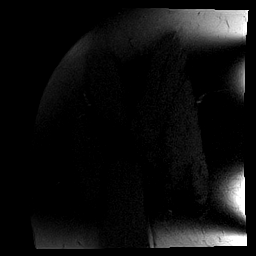

[40 of 40 positions shown; findings below may reference images not displayed]

FINDINGS: Bones: No fracture, stress change or worrisome lesion. No
subchondral cyst formation or edema about the hips. No avascular
necrosis of the femoral heads.

Articular cartilage and labrum

Articular cartilage:  Mildly thinned without focal defect.

Labrum: The left anterior labrum is torn. The superior labrum
appears degenerated without tear identified.

Joint or bursal effusion

Joint effusion: The left hip is distended with contrast. No right
hip effusion.

Bursae: Negative.

Muscles and tendons

Muscles and tendons:  Intact.

Other findings

Miscellaneous: Imaged intrapelvic contents demonstrate no acute
abnormality. The patient is status post hysterectomy.
IMPRESSION: The examination is positive for a tear of the anterior left labrum.
The superior labrum is degenerated without tear. Mild hyaline
cartilage thinning about the left hip also noted.

## 2020-06-27 MED ORDER — HYDROCODONE-ACETAMINOPHEN 5-325 MG PO TABS: 1.0000 | ORAL_TABLET | Freq: Three times a day (TID) | ORAL | 0 refills | Status: DC | PRN

## 2020-06-27 MED ORDER — GADOBUTROL 1 MMOL/ML IV SOLN
1.0000 mL | Freq: Once | INTRAVENOUS | Status: AC | PRN
  Administered 2020-06-27: 1 mL via INTRAVENOUS

## 2020-06-27 NOTE — Progress Notes (Addendum)
    Procedures performed today:    Procedure: Real-time Ultrasound Guided gadolinium contrast injection of left hip joint Device: Samsung HS60  Verbal informed consent obtained.  Time-out conducted.  Noted no overlying erythema, induration, or other signs of local infection.  Skin prepped in a sterile fashion.  Local anesthesia: Topical Ethyl chloride.  With sterile technique and under real time ultrasound guidance: Using a 22-gauge spinal needle advanced to the femoral head/neck junction, contacted bone and injected 1 cc Kenalog 40, 2 cc lidocaine, 2 cc bupivacaine, syringe switched and 0.1 cc gadolinium injected, syringe again switched and 10 cc sterile saline used to distend the joint. Joint visualized and capsule seen distending confirming intra-articular placement of contrast material and medication. Completed without difficulty  Advised to call if fevers/chills, erythema, induration, drainage, or persistent bleeding.  Images permanently stored in PACS Impression: Technically successful ultrasound guided gadolinium contrast injection for MR arthrography.  Please see separate MR arthrogram report.   Independent interpretation of notes and tests performed by another provider:   None.  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 We injected her hip at the last visit with only temporary relief, we are going to proceed with a hip MR arthrography today. She does unfortunately smoke a pack a day. Refilling hydrocodone.  Update: MR arthrogram does confirm a left good-sized hip labral tear, we will do the referral to the hip center for consideration of hip arthroscopy.    ___________________________________________ Ihor Austin.  Benjamin Stain, M.D., ABFM., CAQSM. Primary Care and Sports Medicine [facility]  Adjunct Instructor of Family Medicine  [employer]

## 2020-06-27 NOTE — Assessment & Plan Note (Addendum)
Tammy Casey is a pleasant 50 year old female, she has had months of hip pain, failed conservative treatment, x-ray showed osteoarthritis, she does have mechanical symptoms, positive FADIR sign. We injected her hip at the last visit with only temporary relief, we are going to proceed with a hip MR arthrography today. She does unfortunately smoke a pack a day. Refilling hydrocodone.  Update: MR arthrogram does confirm a left good-sized hip labral tear, we will do the referral to the hip center for consideration of hip arthroscopy.

## 2020-06-27 NOTE — Addendum Note (Signed)
Addended by: Monica Becton on: 06/27/2020 03:25 PM   Modules accepted: Orders

## 2020-06-30 ENCOUNTER — Telehealth: Payer: Self-pay

## 2020-06-30 MED ORDER — ONDANSETRON 8 MG PO TBDP: 8.0000 mg | ORAL_TABLET | Freq: Three times a day (TID) | ORAL | 3 refills | Status: DC | PRN

## 2020-06-30 NOTE — Telephone Encounter (Signed)
No problem at all, Zofran sent in.

## 2020-06-30 NOTE — Telephone Encounter (Signed)
Patient reports that the pain medication is making her very nauseated. She wants to know if you will call in something [facility] [place].

## 2020-06-30 NOTE — Telephone Encounter (Signed)
Patient aware prescription sent in.  

## 2020-07-04 DIAGNOSIS — M25552 Pain in left hip: Secondary | ICD-10-CM | POA: Diagnosis not present

## 2020-07-07 ENCOUNTER — Other Ambulatory Visit: Payer: Self-pay

## 2020-07-07 DIAGNOSIS — S73192D Other sprain of left hip, subsequent encounter: Secondary | ICD-10-CM

## 2020-07-07 DIAGNOSIS — M1612 Unilateral primary osteoarthritis, left hip: Secondary | ICD-10-CM

## 2020-07-07 MED ORDER — HYDROCODONE-ACETAMINOPHEN 5-325 MG PO TABS: 1.0000 | ORAL_TABLET | Freq: Three times a day (TID) | ORAL | 0 refills | Status: DC | PRN

## 2020-07-07 NOTE — Telephone Encounter (Signed)
Patient called requesting a refill of the hydrocodone. She reported that she doesn't see the surgeon until May 23rd. Prescription pended for approval/denial. Rx [facility] to [facility].

## 2020-07-17 ENCOUNTER — Telehealth: Payer: Self-pay | Admitting: *Deleted

## 2020-07-17 NOTE — Telephone Encounter (Signed)
She saw the surgeon on the 26th of April.

## 2020-07-17 NOTE — Telephone Encounter (Signed)
Pt left vm wanting to know if you will refill her pain med until she sees the surgeon on the 23rd.

## 2020-07-20 ENCOUNTER — Other Ambulatory Visit: Payer: Self-pay

## 2020-07-20 DIAGNOSIS — S73192D Other sprain of left hip, subsequent encounter: Secondary | ICD-10-CM

## 2020-07-20 DIAGNOSIS — M1612 Unilateral primary osteoarthritis, left hip: Secondary | ICD-10-CM

## 2020-07-20 MED ORDER — HYDROCODONE-ACETAMINOPHEN 5-325 MG PO TABS: 1.0000 | ORAL_TABLET | Freq: Three times a day (TID) | ORAL | 0 refills | Status: DC | PRN

## 2020-07-20 NOTE — Telephone Encounter (Signed)
Patient reported that she is taking 3 per day.

## 2020-07-20 NOTE — Addendum Note (Signed)
Addended by: Monica Becton on: 07/20/2020 12:20 PM   Modules accepted: Orders

## 2020-07-20 NOTE — Telephone Encounter (Signed)
Patient aware that prescription was sent in and that this will be the last refill. She will need to get any additional pain meds from surgeon.

## 2020-07-20 NOTE — Telephone Encounter (Signed)
She saw the surgeon back on 26 April, looks like they are planning hip replacement, I will call in a small amount of hydrocodone but hydrocodone prior to the hip replacement we will simply increase her need for narcotics for postop pain.  Further management needs to come from her hip surgeon as care has been transferred.

## 2020-07-20 NOTE — Telephone Encounter (Signed)
Patient called for another refill on the hydrocodone since she doesn't see the surgeon until May 23rd.

## 2020-09-18 ENCOUNTER — Other Ambulatory Visit: Payer: Self-pay

## 2020-09-18 ENCOUNTER — Emergency Department
Admission: EM | Admit: 2020-09-18 | Discharge: 2020-09-18 | Disposition: A | Discharge status: Home / Self Care | Payer: BC Managed Care – PPO | Source: Home / Self Care

## 2020-09-18 ENCOUNTER — Encounter: Payer: Self-pay | Admitting: Emergency Medicine

## 2020-09-18 DIAGNOSIS — R3915 Urgency of urination: Secondary | ICD-10-CM

## 2020-09-18 DIAGNOSIS — R300 Dysuria: Secondary | ICD-10-CM

## 2020-09-18 DIAGNOSIS — R350 Frequency of micturition: Secondary | ICD-10-CM | POA: Diagnosis not present

## 2020-09-18 DIAGNOSIS — R310 Gross hematuria: Secondary | ICD-10-CM | POA: Diagnosis not present

## 2020-09-18 LAB — POCT URINALYSIS DIP (MANUAL ENTRY)
Bilirubin, UA: NEGATIVE
Glucose, UA: NEGATIVE mg/dL
Ketones, POC UA: NEGATIVE mg/dL
Leukocytes, UA: NEGATIVE
Nitrite, UA: NEGATIVE
Protein Ur, POC: NEGATIVE mg/dL
Spec Grav, UA: 1.015 (ref 1.010–1.025)
Urobilinogen, UA: 0.2 E.U./dL
pH, UA: 5.5 (ref 5.0–8.0)

## 2020-09-18 MED ORDER — ONDANSETRON 4 MG PO TBDP
4.0000 mg | ORAL_TABLET | Freq: Once | ORAL | Status: AC
  Administered 2020-09-18: 4 mg via ORAL

## 2020-09-18 MED ORDER — NITROFURANTOIN MONOHYD MACRO 100 MG PO CAPS: 100.0000 mg | ORAL_CAPSULE | Freq: Two times a day (BID) | ORAL | 0 refills | Status: DC

## 2020-09-18 NOTE — ED Triage Notes (Signed)
Patient here day 4 of dysuria;  has been taking AZO with last dose 0200. Has not had any covid vaccinations; nor has she had covid.

## 2020-09-18 NOTE — Discharge Instructions (Addendum)
We are going to start you on antibiotics (Macrobid) to cover for urinary tract infection.  We will contact you if we need to adjust your antibiotics based on urine culture results.  Please make sure you are drinking plenty of fluid.  Use Tylenol and ibuprofen for pain relief.  If you have any worsening symptoms including fever, nausea/vomiting that prevents you from oral intake, weakness, increased fatigue you need to be seen immediately.

## 2020-09-18 NOTE — ED Provider Notes (Signed)
[facility]    CSN: [id number] Arrival date & time: 09/18/20  1812      History   Chief Complaint Chief Complaint  Patient presents with   Dysuria    HPI Tammy Casey is a 50 y.o. female.   Patient presents today with a 4-day history of UTI symptoms.  Reports urinary frequency, urinary urgency, dysuria, lower abdominal pain, back pain, fever, malaise.  She denies any vaginal symptoms, chest pain, shortness of breath, dizziness, syncope.  She has tried Azo with temporary improvement of symptoms.  She denies history of recurrent urinary tract infections or history of nephrolithiasis.  Denies any recent antibiotic use.  She denies recent urogenital procedure, self-catheterization, single kidney.  She has not seen a urologist in the past.  She is having difficulty with daily activities as result of symptoms.   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      Home Medications    Prior to Admission medications   Medication Sig Start Date End Date Taking? Authorizing Provider  nitrofurantoin, macrocrystal-monohydrate, (MACROBID) 100 MG capsule Take 1 capsule (100 mg total) by mouth 2 (two) times daily. 09/18/20  Yes Gianmarco Roye, Noberto Retort, PA-C    Family History Family History  Problem Relation Age of Onset   Hypertension Mother    Heart attack Mother    Hypertension Maternal Aunt    Hypertension Maternal Uncle    Heart attack Maternal Uncle    Hypertension Maternal Grandmother    Hypertension Maternal Grandfather    Heart attack Maternal Grandfather     Social History Social History   Tobacco Use   Smoking status: Every Day    Packs/day: 1.00    Years: 15.00     Pack years: 15.00    Types: Cigarettes   Smokeless tobacco: Never  Vaping Use   Vaping Use: Never used  Substance Use Topics   Alcohol use: Yes    Alcohol/week: 1.0 standard drink    Types: 1 Standard drinks or equivalent per week   Drug use: Never     Allergies   Amoxicillin, Penicillin g, Azithromycin, and Cefdinir   Review of Systems Review of Systems  Constitutional:  Positive for activity change, fatigue and fever. Negative for appetite change.  Respiratory:  Negative for cough and shortness of breath.   Cardiovascular:  Negative for chest pain.  Gastrointestinal:  Positive for nausea. Negative for abdominal pain, diarrhea and vomiting.  Genitourinary:  Positive for dysuria, frequency, hematuria and urgency. Negative for flank pain, pelvic pain, vaginal bleeding, vaginal discharge and vaginal pain.  Musculoskeletal:  Positive for back pain. Negative for arthralgias and myalgias.  Neurological:  Negative for dizziness, light-headedness and headaches.    Physical Exam Triage Vital Signs ED Triage Vitals  Enc Vitals Group     BP --      Pulse --      Resp --      Temp --      Temp src --      SpO2 --      Weight 09/18/20 1911 150 lb (68 kg)     Height 09/18/20  1911 5\' 4"  (1.626 m)     Head Circumference --      Peak Flow --      Pain Score 09/18/20 1910 7     Pain Loc --      Pain Edu? --      Excl. in GC? --    No data found.  Updated Vital Signs BP 131/89 (BP Location: Right Arm)   Pulse (!) 102   Temp 97.7 F (36.5 C) (Oral)   Resp 16   Ht 5\' 4"  (1.626 m)   Wt 150 lb (68 kg)   LMP 04/09/2010   SpO2 96%   BMI 25.75 kg/m   Visual Acuity Right Eye Distance:   Left Eye Distance:   Bilateral Distance:    Right Eye Near:   Left Eye Near:    Bilateral Near:     Physical Exam Vitals reviewed.  Constitutional:      General: She is awake. She is not in acute distress.    Appearance: Normal appearance. She is normal weight. She is not  ill-appearing.     Comments: Very pleasant female appears stated age in no acute distress sitting comfortably in exam room  HENT:     Head: Normocephalic and atraumatic.  Cardiovascular:     Rate and Rhythm: Normal rate and regular rhythm.     Heart sounds: Normal heart sounds, S1 normal and S2 normal. No murmur heard. Pulmonary:     Effort: Pulmonary effort is normal.     Breath sounds: Normal breath sounds. No wheezing, rhonchi or rales.     Comments: Clear to auscultation bilaterally Abdominal:     General: Bowel sounds are normal.     Palpations: Abdomen is soft.     Tenderness: There is no abdominal tenderness. There is no right CVA tenderness, left CVA tenderness, guarding or rebound.     Comments: Benign abdominal exam; no tenderness palpation.  No CVA tenderness.  Genitourinary:    Comments: Exam deferred Psychiatric:        Behavior: Behavior is cooperative.     UC Treatments / Results  Labs (all labs ordered are listed, but only abnormal results are displayed) Labs Reviewed  POCT URINALYSIS DIP (MANUAL ENTRY) - Abnormal; Notable for the following components:      Result Value   Blood, UA small (*)    All other components within normal limits  URINE CULTURE    EKG   Radiology No results found.  Procedures Procedures (including critical care time)  Medications Ordered in UC Medications  ondansetron (ZOFRAN-ODT) disintegrating tablet 4 mg (4 mg Oral Given 09/18/20 1939)    Initial Impression / Assessment and Plan / UC Course  I have reviewed the triage vital signs and the nursing notes.  Pertinent labs & imaging results that were available during my care of the patient were reviewed by me and considered in my medical decision making (see chart for details).      Patient was given Zofran in clinic with minimal improvement of nausea.  UA showed hematuria but was otherwise normal.  Given clinical presentation will empirically treat with Macrobid twice daily.   Discussed potential need to change antibiotics based on susceptibilities identified on culture.  Patient was instructed to drink plenty fluids and rest.  Discussed alarm symptoms that warrant emergent evaluation.  Recommended she alternate Tylenol and ibuprofen for pain relief.  Strict return precautions given to which patient expressed understanding.  Final Clinical Impressions(s) / UC Diagnoses  Final diagnoses:  Dysuria  Urinary frequency  Urinary urgency  Gross hematuria     Discharge Instructions      We are going to start you on antibiotics (Macrobid) to cover for urinary tract infection.  We will contact you if we need to adjust your antibiotics based on urine culture results.  Please make sure you are drinking plenty of fluid.  Use Tylenol and ibuprofen for pain relief.  If you have any worsening symptoms including fever, nausea/vomiting that prevents you from oral intake, weakness, increased fatigue you need to be seen immediately.     ED Prescriptions     Medication Sig Dispense Auth. Provider   nitrofurantoin, macrocrystal-monohydrate, (MACROBID) 100 MG capsule Take 1 capsule (100 mg total) by mouth 2 (two) times daily. 10 capsule Tanazia Achee, Noberto Retort, PA-C      PDMP not reviewed this encounter.   Jeani Hawking, PA-C 09/18/20 2000

## 2020-09-20 LAB — URINE CULTURE
MICRO NUMBER:: 12106710
Result:: NO GROWTH
SPECIMEN QUALITY:: ADEQUATE

## 2020-10-24 NOTE — Telephone Encounter (Signed)
Opened in error

## 2021-04-17 ENCOUNTER — Ambulatory Visit: Payer: Self-pay

## 2021-06-18 ENCOUNTER — Ambulatory Visit (INDEPENDENT_AMBULATORY_CARE_PROVIDER_SITE_OTHER): Payer: BC Managed Care – PPO

## 2021-06-18 ENCOUNTER — Encounter: Payer: Self-pay | Admitting: Medical-Surgical

## 2021-06-18 ENCOUNTER — Ambulatory Visit: Payer: BC Managed Care – PPO | Admitting: Medical-Surgical

## 2021-06-18 ENCOUNTER — Ambulatory Visit: Payer: Self-pay | Admitting: Medical-Surgical

## 2021-06-18 VITALS — BP 151/101 | HR 111 | Resp 20 | Ht 64.0 in | Wt 158.0 lb

## 2021-06-18 DIAGNOSIS — M25561 Pain in right knee: Secondary | ICD-10-CM | POA: Diagnosis not present

## 2021-06-18 IMAGING — DX DG KNEE COMPLETE 4+V*R*
4 series · 4 of 4 positions shown · non-contrast
Comparison: None.

CLINICAL DATA: Previous fall, anterior knee pain

EXAM:
RIGHT KNEE - COMPLETE 4+ VIEW

[knee ap]
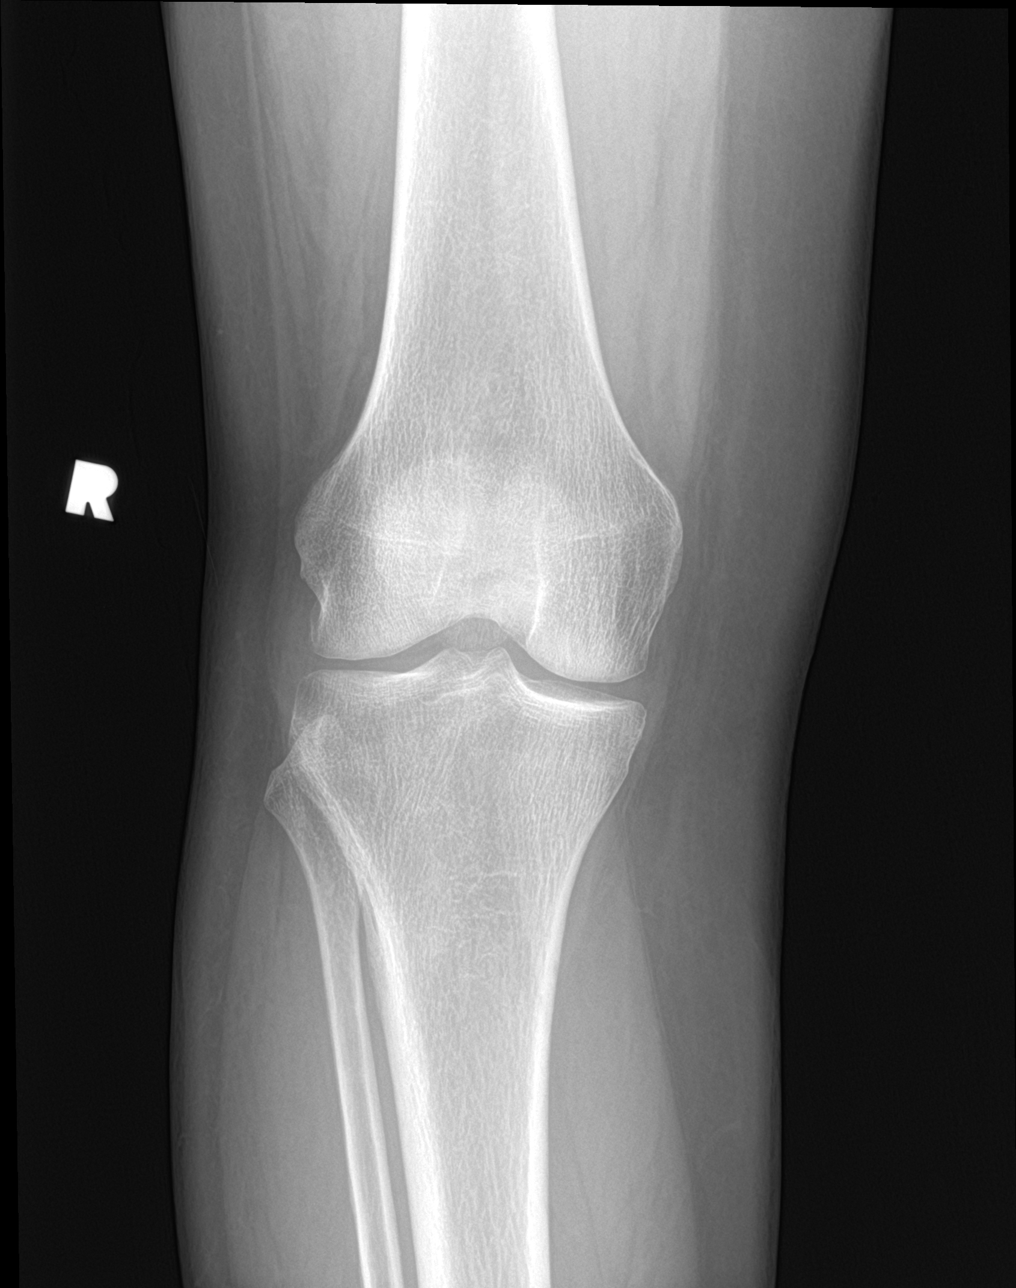

[knee lat]
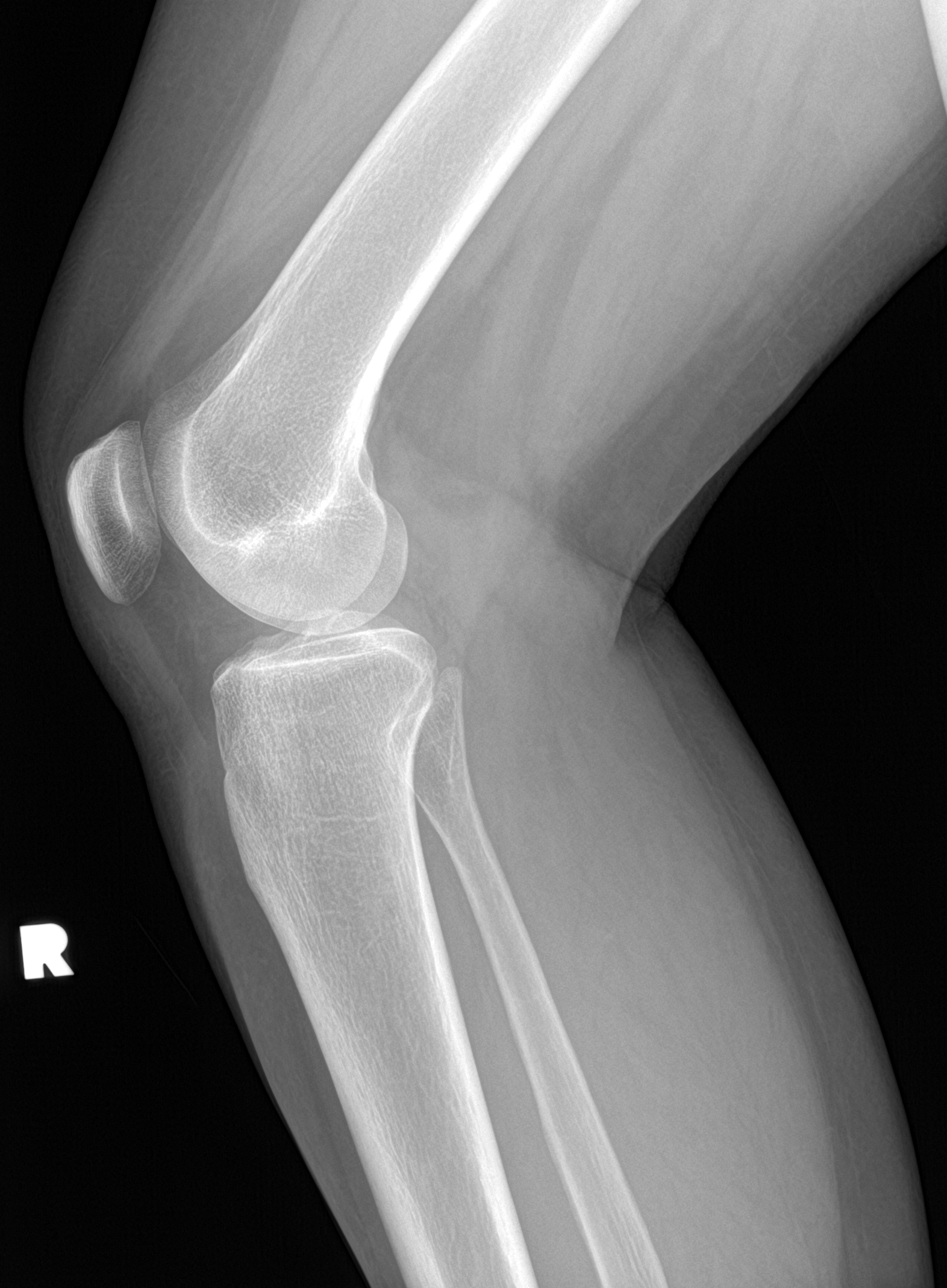

[knee obl (1 of 2)]
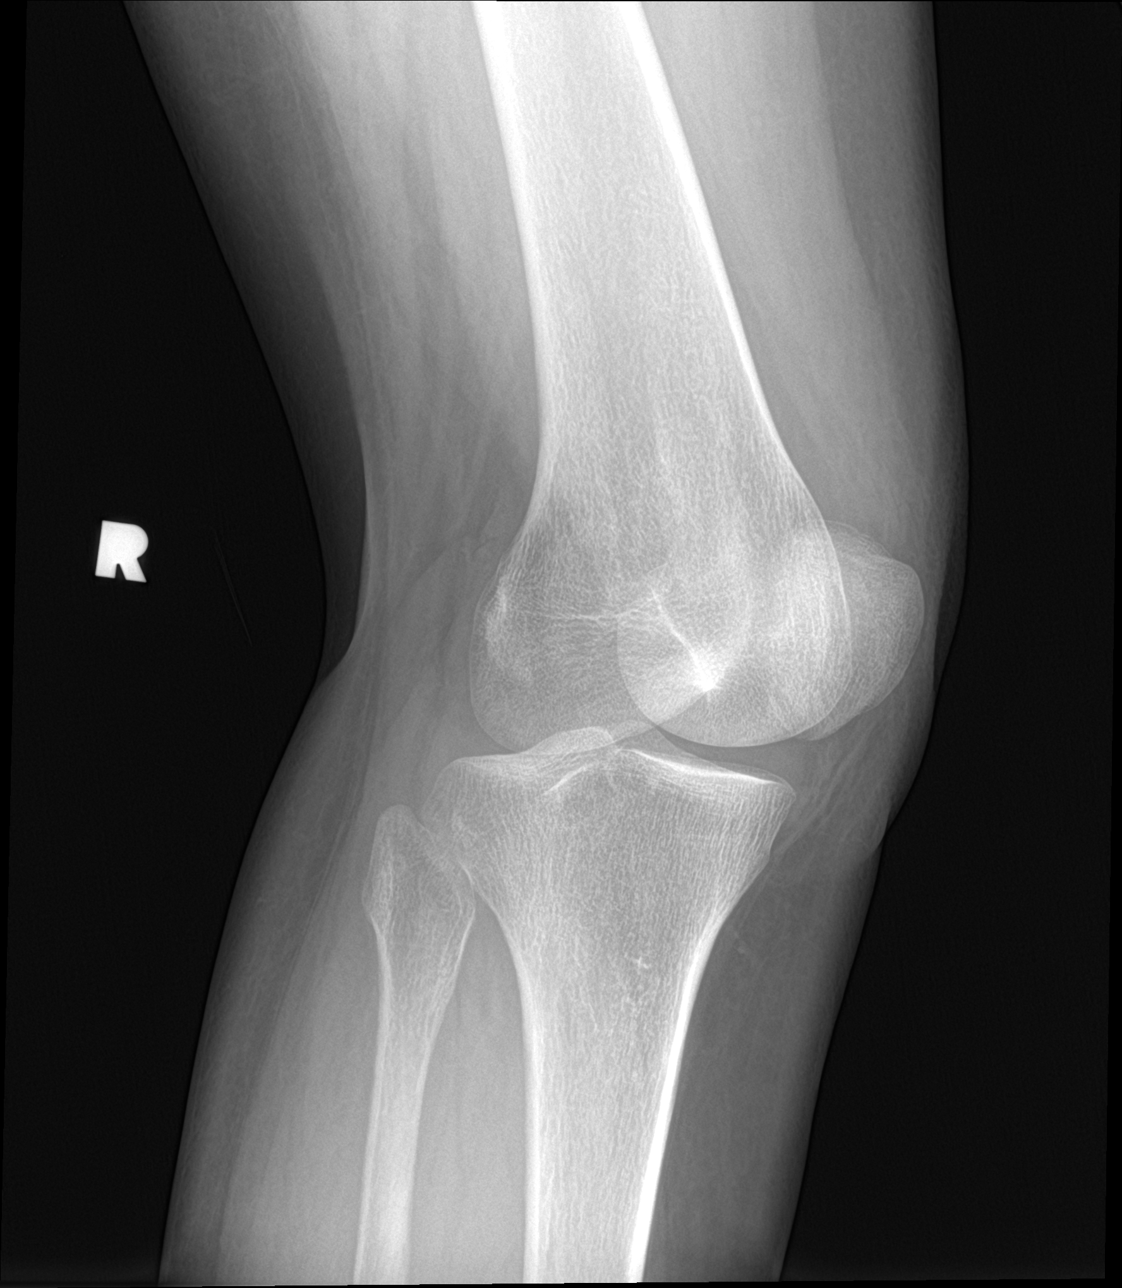

[knee obl (2 of 2)]
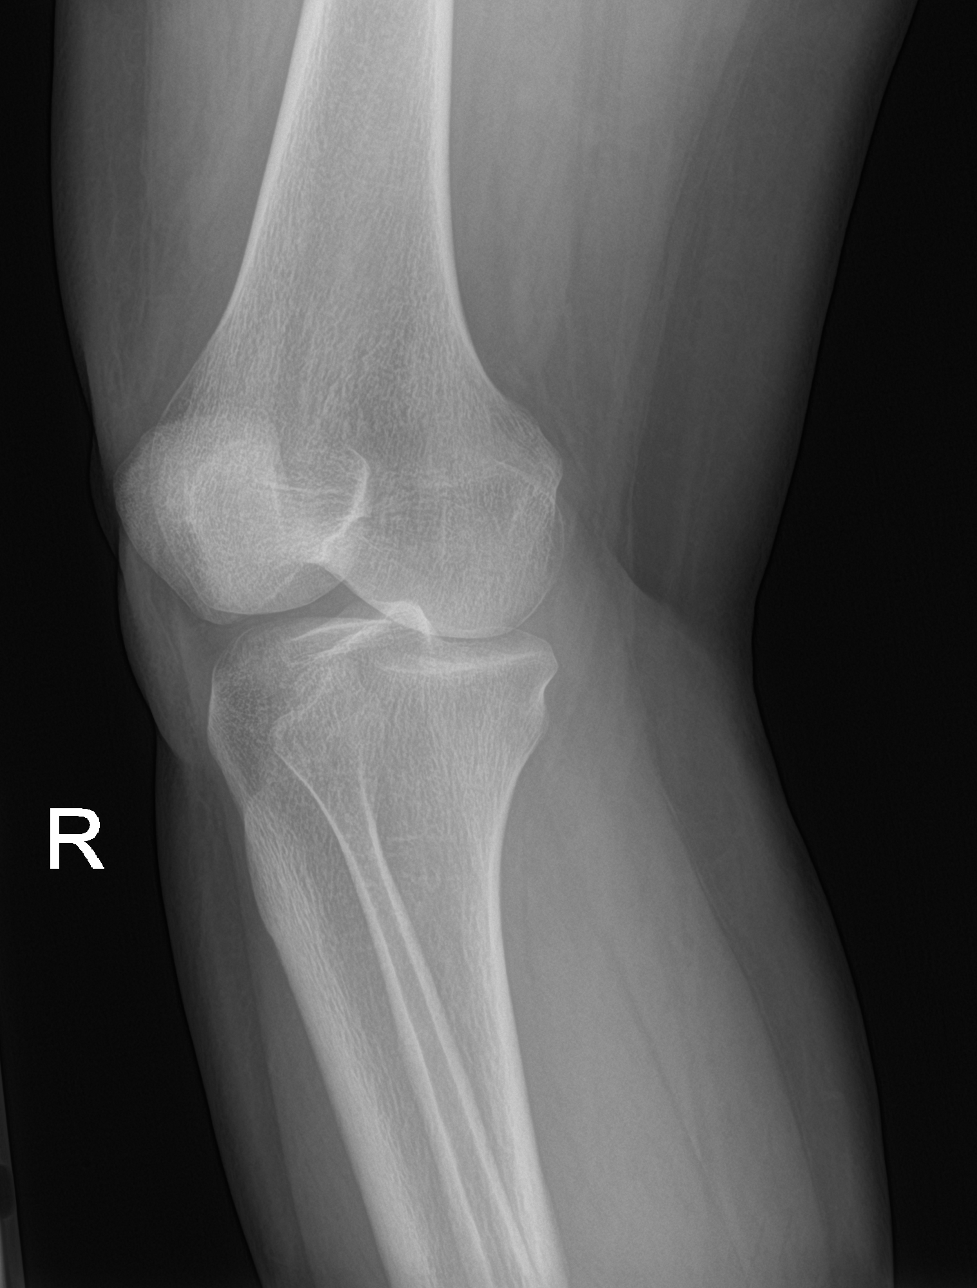

[4 of 4 positions shown; findings below may reference images not displayed]

FINDINGS: No evidence of fracture, dislocation, or joint effusion. No evidence
of arthropathy or other focal bone abnormality. Soft tissues are
unremarkable.
IMPRESSION: Negative.

## 2021-06-18 MED ORDER — HYDROCODONE-ACETAMINOPHEN 5-325 MG PO TABS: 1.0000 | ORAL_TABLET | Freq: Three times a day (TID) | ORAL | 0 refills | Status: DC | PRN

## 2021-06-18 NOTE — Progress Notes (Signed)
?  HPI with pertinent ROS:  ? ?CC: right knee pain ? ?HPI: ?Pleasant 51 year old female presenting today for evaluation of right knee pain. She notes that she fell on it when going down the stairs on Christmas eve.  She has had intermittent pain in the right knee that she has treated conservatively.  On Saturday, she was at work when she knelt down on the floor and landed hard on the right knee.  Since then, she has had sharp, stabbing pain in the medial lower aspect of the knee, worse with weightbearing and bending.  Having difficulty with kneeling and getting dressed.  Has tried Tylenol, ibuprofen, rest, heat, and ice without relief of symptoms. ? ?I reviewed the past medical history, family history, social history, surgical history, and allergies today and no changes were needed.  Please see the problem list section below in epic for further details. ? ?Physical exam:  ? ?General: Well Developed, well nourished, and in no acute distress.  ?Neuro: Alert and oriented x3.  ?HEENT: Normocephalic, atraumatic.  ?Skin: Warm and dry. ?Cardiac: Regular rate and rhythm, no murmurs rubs or gallops, no lower extremity edema.  ?Respiratory: Clear to auscultation bilaterally. Not using accessory muscles, speaking in full sentences. ?Right knee: Exam greatly limited by pain.  Poorly tolerated flexion, extension, internal rotation, external rotation, and palpation over the medial lower aspect of the knee. ? ?Impression and Recommendations:   ? ?1. Acute pain of right knee ?Getting x-rays today.  Adding a knee brace.  She has crutches at home that she can use for longer periods of walking.  Sending in Norco 1 tablet every 8 hours as needed for up to 5 days.  Okay to continue ibuprofen but limit Tylenol, not to exceed 3,000 mg/day.  Continue ice, rest, and elevation.  Offered work note but she will let me know if this is needed. ?- DG Knee Complete 4 Views Right; Future ? ?Return for further evaluation with Dr. Karie Schwalbe.  . ?___________________________________________ ?Thayer Ohm, DNP, APRN, FNP-BC ?Primary Care and Sports Medicine ?[facility] ?

## 2021-06-19 ENCOUNTER — Encounter: Payer: Self-pay | Admitting: Medical-Surgical

## 2021-06-19 ENCOUNTER — Encounter: Payer: Self-pay | Admitting: Sports Medicine

## 2021-06-19 DIAGNOSIS — M25561 Pain in right knee: Secondary | ICD-10-CM

## 2021-06-19 NOTE — Telephone Encounter (Signed)
To ordering provider, I have no idea, I have not even laid hands on her knee. ?

## 2021-06-22 ENCOUNTER — Telehealth: Payer: Self-pay

## 2021-06-22 NOTE — Telephone Encounter (Signed)
[employer] called to have the referral for the MRI sent to [facility] in [place] ? ?Fax #: [phone] ? ?Reference #: [id number] ? ?Phone # to [facility] [phone] ?Option 2 then 3 ?

## 2021-06-22 NOTE — Telephone Encounter (Signed)
Looks like imaging is authorized for [facility] - [address], this is where patient is scheduled.  ? ? Marylene Land - can you double check me?  ?

## 2021-06-24 ENCOUNTER — Ambulatory Visit
Admission: RE | Admit: 2021-06-24 | Discharge: 2021-06-24 | Disposition: A | Discharge status: Home / Self Care | Payer: BC Managed Care – PPO | Source: Ambulatory Visit | Attending: Medical-Surgical | Admitting: Medical-Surgical

## 2021-06-24 DIAGNOSIS — M25561 Pain in right knee: Secondary | ICD-10-CM | POA: Diagnosis not present

## 2021-06-24 IMAGING — MR MR KNEE*R* W/O CM
4 of 6 series · 20 of 40 positions shown · non-contrast
Comparison: Right knee radiographs [DATE]

CLINICAL DATA: Severe right knee pain acute. Anterior lower part of
knee.

EXAM:
MRI OF THE RIGHT KNEE WITHOUT CONTRAST
TECHNIQUE: Multiplanar, multisequence MR imaging of the knee was performed. No
intravenous contrast was administered.

[Series 3: T2 fat-sat · axial · 4.0mm · 0.50mm/px · z∈[-55,+34]mm · 3 of 23 slices shown (1 of 2)]
[im 5/23]
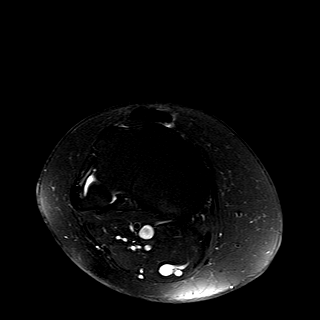
[im 14/23]
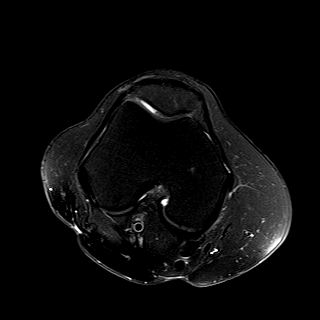
[im 23/23]
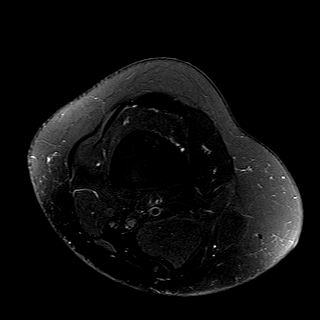

[Series 5: T2 fat-sat · coronal · 4.0mm · 0.29mm/px · 3 of 24 slices shown (2 of 2)]
[im 5/24]
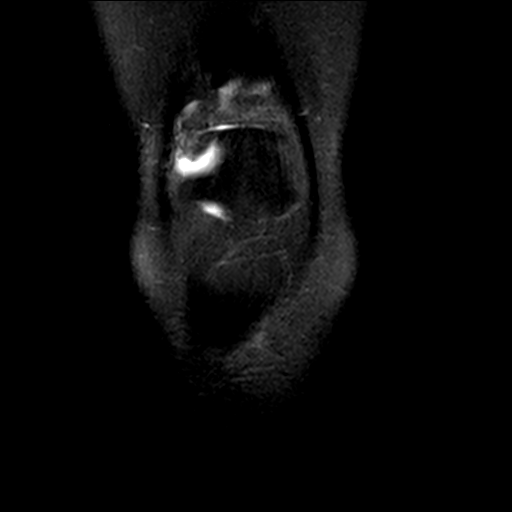
[im 14/24]
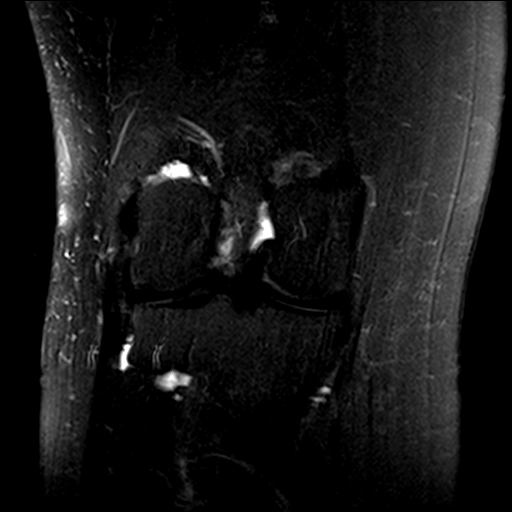
[im 24/24]
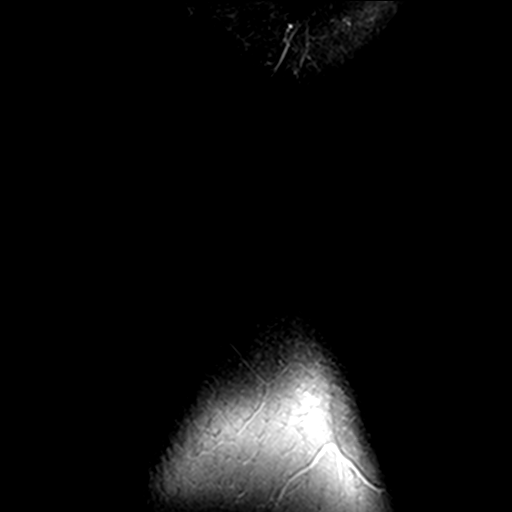

[Series 7: PD fat-sat · sagittal · 3.0mm · 0.29mm/px · 7 of 27 slices shown (1 of 2)]
[im 1/27]
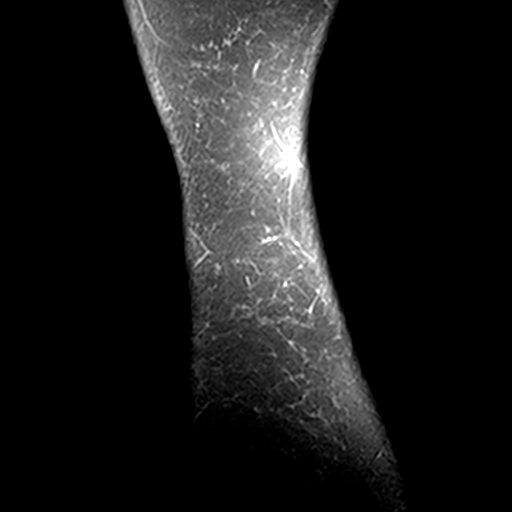
[im 5/27]
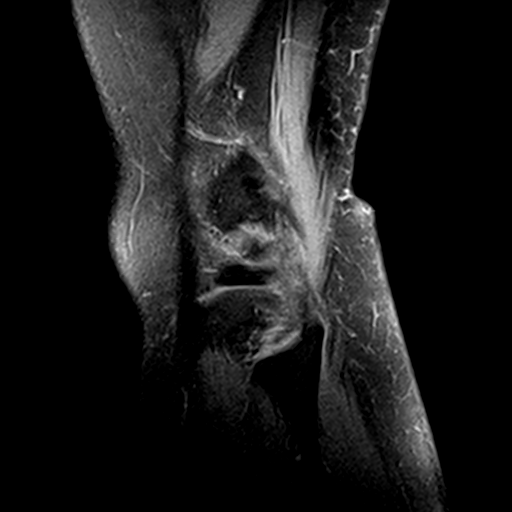
[im 9/27]
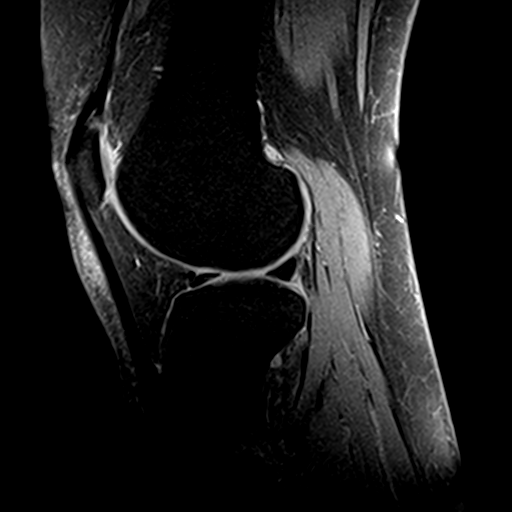
[im 14/27]
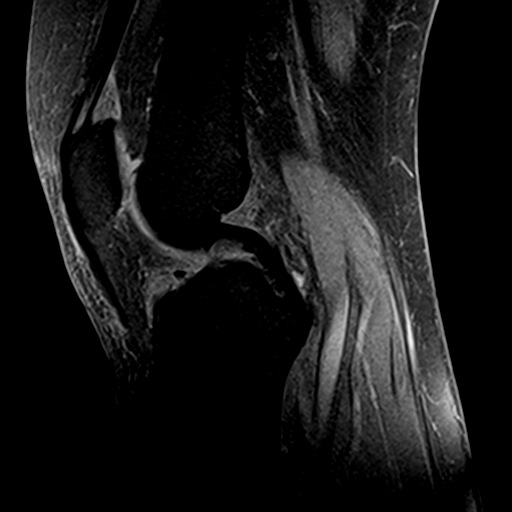
[im 18/27]
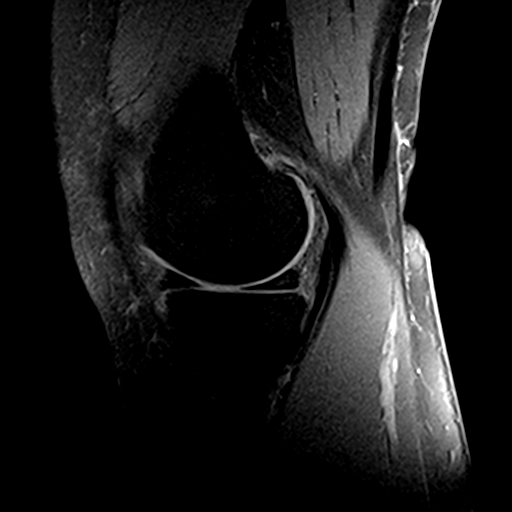
[im 22/27]
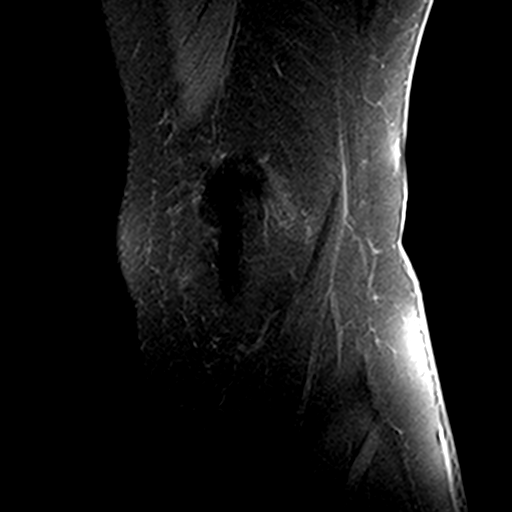
[im 27/27]
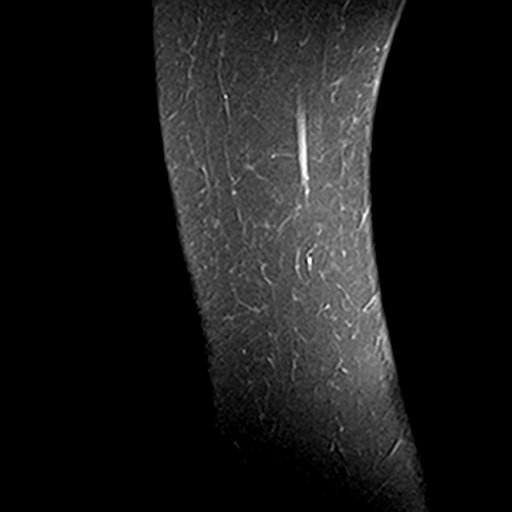

[Series 8: PD fat-sat · coronal · 3.0mm · 0.29mm/px · 7 of 30 slices shown (2 of 2)]
[im 1/30]
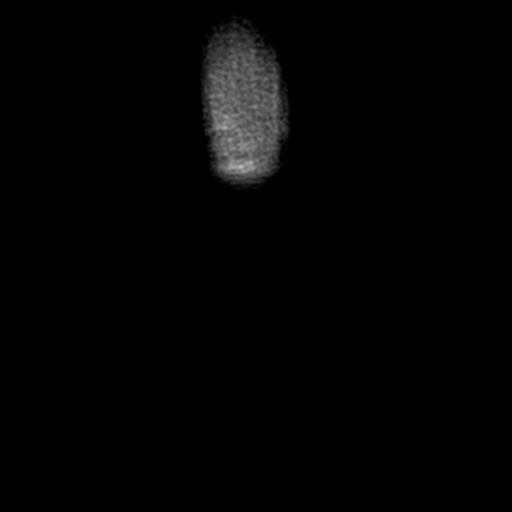
[im 5/30]
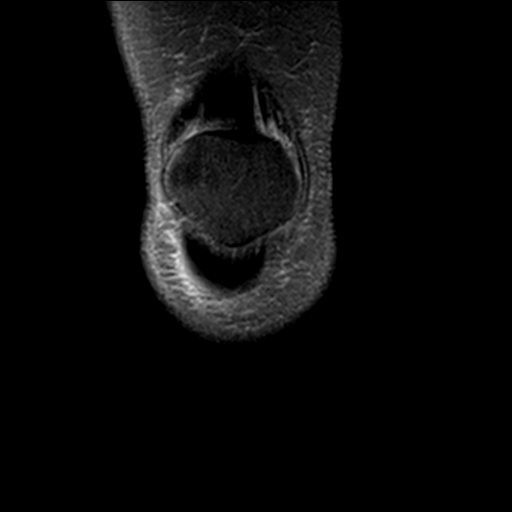
[im 9/30]
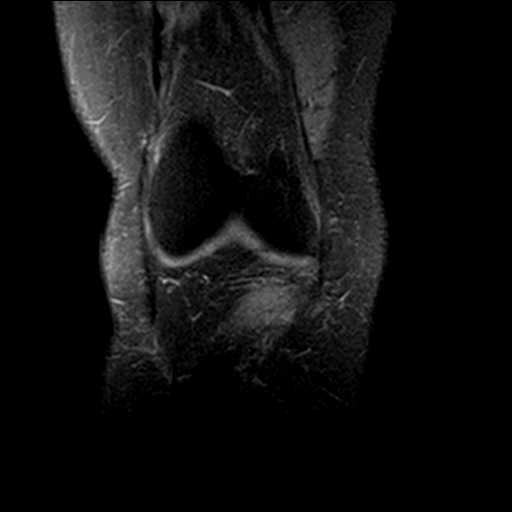
[im 13/30]
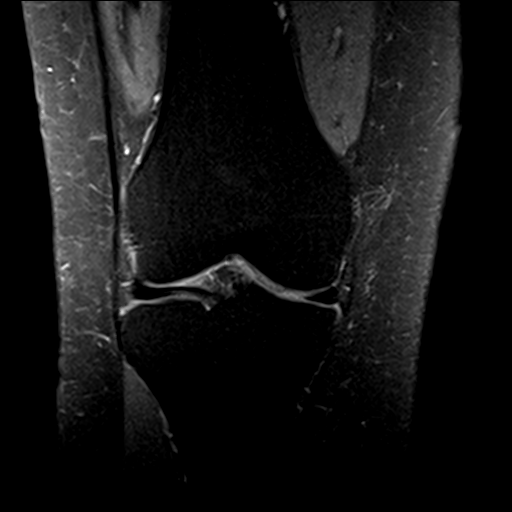
[im 17/30]
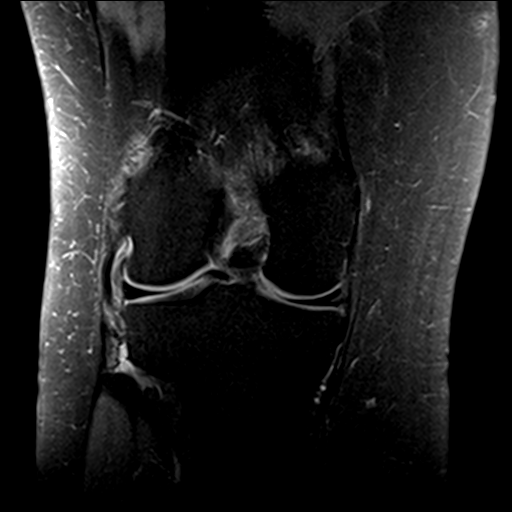
[im 21/30]
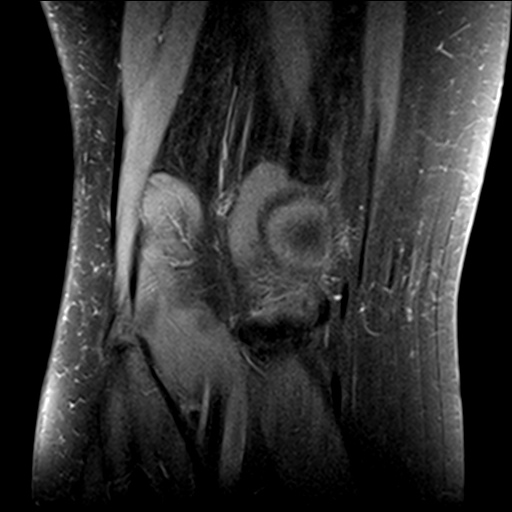
[im 25/30]
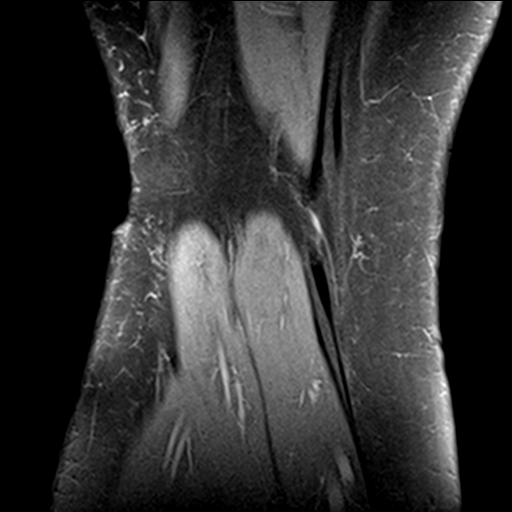

[20 of 40 positions shown; findings below may reference images not displayed]

FINDINGS: MENISCI

Medial meniscus: There is mild-to-moderate intermediate proton
density signal intrasubstance degeneration throughout the posterior
horn of the medial meniscus, however no fluid bright tear is seen
extending through the articular surface. There is a increased proton
density signal within the root of the anterior horn of the medial
meniscus on sagittal image 16, and lack of meniscal tissue where it
is still expected at the more central aspect of the root of the
anterior horn on sagittal image 15). This appears to represent at
least intrasubstance degeneration of the root.

Lateral meniscus:  Intact.

LIGAMENTS

Cruciates: The ACL and PCL are intact.

Collaterals: The medial collateral ligament is intact. The fibular
collateral ligament, biceps femoris tendon, iliotibial band, and
popliteus tendon are intact.

CARTILAGE

Patellofemoral: Moderate thinning of the inferior medial trochlear
cartilage.

Medial: Partial-thickness cartilage loss within the mid
weight-bearing medial femoral condyle cartilage.

Lateral:  Intact.

Joint: Borderline tinyjoint effusion. Normal Hoffa's fat pad. No
plical thickening.

Popliteal Fossa:  No Baker's cyst.

Extensor Mechanism:  Intact quadriceps tendon and patellar tendon.

Bones:  No acute fracture or dislocation.

Other: None.
IMPRESSION: :
IMPRESSION: 1. At least intrasubstance degeneration at the root of the anterior
horn of the medial meniscus with a apossible focal root tear.
2. Mild patellofemoral and medial compartment cartilage degenerative
changes.

## 2021-06-26 ENCOUNTER — Encounter: Payer: Self-pay | Admitting: Medical-Surgical

## 2021-06-26 ENCOUNTER — Encounter: Payer: Self-pay | Admitting: Sports Medicine

## 2021-06-26 NOTE — Telephone Encounter (Signed)
See other MyChart message

## 2021-06-27 NOTE — Telephone Encounter (Signed)
Yes it was done with [facility].  ?

## 2021-07-02 ENCOUNTER — Ambulatory Visit: Payer: BC Managed Care – PPO | Admitting: Sports Medicine

## 2021-07-02 ENCOUNTER — Ambulatory Visit (INDEPENDENT_AMBULATORY_CARE_PROVIDER_SITE_OTHER): Payer: BC Managed Care – PPO

## 2021-07-02 ENCOUNTER — Encounter: Payer: Self-pay | Admitting: Medical-Surgical

## 2021-07-02 DIAGNOSIS — M1711 Unilateral primary osteoarthritis, right knee: Secondary | ICD-10-CM

## 2021-07-02 NOTE — Assessment & Plan Note (Addendum)
Severe pain right knee, anterior/medial joint line, MRI showed osteoarthritis and meniscal degenerative changes, oral analgesics ineffective, bracing not effective, activity modification not effective, injected today, return to see me in 6 weeks, viscosupplementation if not better. ? ?Going to [facility] in 6 weeks or so. ?

## 2021-07-02 NOTE — Progress Notes (Signed)
? ? ?  Procedures performed today:   ? ?Procedure: Real-time Ultrasound Guided injection of the right knee ?Device: Samsung HS60  ?Verbal informed consent obtained.  ?Time-out conducted.  ?Noted no overlying erythema, induration, or other signs of local infection.  ?Skin prepped in a sterile fashion.  ?Local anesthesia: Topical Ethyl chloride.  ?With sterile technique and under real time ultrasound guidance: Mild effusion noted, 1 cc Kenalog 40, 2 cc lidocaine, 2 cc bupivacaine injected easily ?Completed without difficulty  ?Advised to call if fevers/chills, erythema, induration, drainage, or persistent bleeding.  ?Images permanently stored and available for review in PACS.  ?Impression: Technically successful ultrasound guided injection. ? ?Independent interpretation of notes and tests performed by another provider:  ? ?None. ? ?Brief History, Exam, Impression, and Recommendations:   ? ?Primary osteoarthritis of right knee ?Severe pain right knee, anterior/medial joint line, MRI showed osteoarthritis and meniscal degenerative changes, oral analgesics ineffective, bracing not effective, activity modification not effective, injected today, return to see me in 6 weeks, viscosupplementation if not better. ? ?Going to [facility] in 6 weeks or so. ? ?Chronic process with exacerbation and pharmacologic intervention ? ?___________________________________________ ?Gwen Her. Dianah Field, M.D., ABFM., CAQSM. ?Primary Care and Sports Medicine ?[place] ? ?Adjunct Instructor of Family Medicine  ?[employer] ?

## 2021-08-13 ENCOUNTER — Ambulatory Visit: Payer: BC Managed Care – PPO | Admitting: Sports Medicine

## 2021-08-13 DIAGNOSIS — M7051 Other bursitis of knee, right knee: Secondary | ICD-10-CM | POA: Diagnosis not present

## 2021-08-13 DIAGNOSIS — M1711 Unilateral primary osteoarthritis, right knee: Secondary | ICD-10-CM | POA: Diagnosis not present

## 2021-08-13 NOTE — Assessment & Plan Note (Signed)
Persistent anterior/medial joint line pain, MRI did show osteoarthritis, mild, as well as some meniscal tearing. Oral analgesics, bracing, activity modification were not effective so we injected the knee at the last visit end of April. Unfortunately continuing to have pain, only had a day of relief. I would like her to touch base with Dr. Everardo Pacific for discussion of arthroscopic debridement, of note we need to time this correctly because she is going to [place] middle of July.

## 2021-08-13 NOTE — Assessment & Plan Note (Signed)
Increasing pain just distal to the medial tibial joint line. Suspect pes bursitis, this was injected today. Home conditioning given.

## 2021-08-13 NOTE — Progress Notes (Signed)
    Procedures performed today:    Procedure:  Injection of right pes anserine bursa Consent obtained and verified. Time-out conducted. Noted no overlying erythema, induration, or other signs of local infection. Skin prepped in a sterile fashion. Topical analgesic spray: Ethyl chloride. Completed without difficulty. Meds: 1 cc lidocaine, 1 cc bupivacaine, 1 cc Kenalog 40 injected easily Advised to call if fevers/chills, erythema, induration, drainage, or persistent bleeding.  Independent interpretation of notes and tests performed by another provider:   None.  Brief History, Exam, Impression, and Recommendations:    Pes anserinus bursitis of right knee Increasing pain just distal to the medial tibial joint line. Suspect pes bursitis, this was injected today. Home conditioning given.  Primary osteoarthritis of right knee Persistent anterior/medial joint line pain, MRI did show osteoarthritis, mild, as well as some meniscal tearing. Oral analgesics, bracing, activity modification were not effective so we injected the knee at the last visit end of April. Unfortunately continuing to have pain, only had a day of relief. I would like her to touch base with Dr. Griffin Basil for discussion of arthroscopic debridement, of note we need to time this correctly because she is going to [facility] middle of July.    ___________________________________________ Gwen Her. Dianah Field, M.D., ABFM., CAQSM. Primary Care and [place] Instructor of [place] of [facility] of Medicine

## 2021-08-14 DIAGNOSIS — M25561 Pain in right knee: Secondary | ICD-10-CM | POA: Diagnosis not present

## 2021-08-27 DIAGNOSIS — M7051 Other bursitis of knee, right knee: Secondary | ICD-10-CM | POA: Diagnosis not present

## 2021-08-27 DIAGNOSIS — M958 Other specified acquired deformities of musculoskeletal system: Secondary | ICD-10-CM | POA: Diagnosis not present

## 2021-08-27 DIAGNOSIS — S83241A Other tear of medial meniscus, current injury, right knee, initial encounter: Secondary | ICD-10-CM | POA: Diagnosis not present

## 2021-08-30 DIAGNOSIS — M94261 Chondromalacia, right knee: Secondary | ICD-10-CM | POA: Diagnosis not present

## 2021-08-30 DIAGNOSIS — R262 Difficulty in walking, not elsewhere classified: Secondary | ICD-10-CM | POA: Diagnosis not present

## 2021-08-30 DIAGNOSIS — M6281 Muscle weakness (generalized): Secondary | ICD-10-CM | POA: Diagnosis not present

## 2021-08-30 DIAGNOSIS — M25661 Stiffness of right knee, not elsewhere classified: Secondary | ICD-10-CM | POA: Diagnosis not present

## 2021-09-04 DIAGNOSIS — M94261 Chondromalacia, right knee: Secondary | ICD-10-CM | POA: Diagnosis not present

## 2021-09-06 DIAGNOSIS — M6281 Muscle weakness (generalized): Secondary | ICD-10-CM | POA: Diagnosis not present

## 2021-09-06 DIAGNOSIS — M94261 Chondromalacia, right knee: Secondary | ICD-10-CM | POA: Diagnosis not present

## 2021-09-06 DIAGNOSIS — M25661 Stiffness of right knee, not elsewhere classified: Secondary | ICD-10-CM | POA: Diagnosis not present

## 2021-09-06 DIAGNOSIS — R262 Difficulty in walking, not elsewhere classified: Secondary | ICD-10-CM | POA: Diagnosis not present

## 2021-09-12 DIAGNOSIS — R262 Difficulty in walking, not elsewhere classified: Secondary | ICD-10-CM | POA: Diagnosis not present

## 2021-09-12 DIAGNOSIS — M25661 Stiffness of right knee, not elsewhere classified: Secondary | ICD-10-CM | POA: Diagnosis not present

## 2021-09-12 DIAGNOSIS — M94261 Chondromalacia, right knee: Secondary | ICD-10-CM | POA: Diagnosis not present

## 2021-09-12 DIAGNOSIS — M6281 Muscle weakness (generalized): Secondary | ICD-10-CM | POA: Diagnosis not present

## 2021-09-18 DIAGNOSIS — M94261 Chondromalacia, right knee: Secondary | ICD-10-CM | POA: Diagnosis not present

## 2021-09-18 DIAGNOSIS — M25661 Stiffness of right knee, not elsewhere classified: Secondary | ICD-10-CM | POA: Diagnosis not present

## 2021-09-18 DIAGNOSIS — M6281 Muscle weakness (generalized): Secondary | ICD-10-CM | POA: Diagnosis not present

## 2021-09-18 DIAGNOSIS — R262 Difficulty in walking, not elsewhere classified: Secondary | ICD-10-CM | POA: Diagnosis not present

## 2021-09-25 ENCOUNTER — Ambulatory Visit: Payer: BC Managed Care – PPO | Admitting: Sports Medicine

## 2021-09-25 DIAGNOSIS — M94261 Chondromalacia, right knee: Secondary | ICD-10-CM | POA: Diagnosis not present

## 2021-09-25 DIAGNOSIS — M6281 Muscle weakness (generalized): Secondary | ICD-10-CM | POA: Diagnosis not present

## 2021-09-25 DIAGNOSIS — M25661 Stiffness of right knee, not elsewhere classified: Secondary | ICD-10-CM | POA: Diagnosis not present

## 2021-09-25 DIAGNOSIS — R262 Difficulty in walking, not elsewhere classified: Secondary | ICD-10-CM | POA: Diagnosis not present

## 2021-09-27 DIAGNOSIS — R262 Difficulty in walking, not elsewhere classified: Secondary | ICD-10-CM | POA: Diagnosis not present

## 2021-09-27 DIAGNOSIS — M25661 Stiffness of right knee, not elsewhere classified: Secondary | ICD-10-CM | POA: Diagnosis not present

## 2021-09-27 DIAGNOSIS — M94261 Chondromalacia, right knee: Secondary | ICD-10-CM | POA: Diagnosis not present

## 2021-09-27 DIAGNOSIS — M6281 Muscle weakness (generalized): Secondary | ICD-10-CM | POA: Diagnosis not present

## 2021-10-02 ENCOUNTER — Other Ambulatory Visit: Payer: Self-pay | Admitting: Orthopaedic Surgery

## 2021-10-02 DIAGNOSIS — M94261 Chondromalacia, right knee: Secondary | ICD-10-CM | POA: Diagnosis not present

## 2021-10-02 DIAGNOSIS — R262 Difficulty in walking, not elsewhere classified: Secondary | ICD-10-CM | POA: Diagnosis not present

## 2021-10-02 DIAGNOSIS — M6281 Muscle weakness (generalized): Secondary | ICD-10-CM | POA: Diagnosis not present

## 2021-10-02 DIAGNOSIS — M25661 Stiffness of right knee, not elsewhere classified: Secondary | ICD-10-CM | POA: Diagnosis not present

## 2021-10-02 DIAGNOSIS — M79604 Pain in right leg: Secondary | ICD-10-CM

## 2021-10-03 ENCOUNTER — Ambulatory Visit
Admission: RE | Admit: 2021-10-03 | Discharge: 2021-10-03 | Disposition: A | Discharge status: Home / Self Care | Payer: BC Managed Care – PPO | Source: Ambulatory Visit | Attending: Orthopaedic Surgery | Admitting: Orthopaedic Surgery

## 2021-10-03 DIAGNOSIS — Z9889 Other specified postprocedural states: Secondary | ICD-10-CM | POA: Diagnosis not present

## 2021-10-03 DIAGNOSIS — M79661 Pain in right lower leg: Secondary | ICD-10-CM | POA: Diagnosis not present

## 2021-10-03 DIAGNOSIS — M79604 Pain in right leg: Secondary | ICD-10-CM

## 2021-10-17 DIAGNOSIS — M94261 Chondromalacia, right knee: Secondary | ICD-10-CM | POA: Diagnosis not present

## 2022-01-03 DIAGNOSIS — G8929 Other chronic pain: Secondary | ICD-10-CM | POA: Diagnosis not present

## 2022-01-03 DIAGNOSIS — M25561 Pain in right knee: Secondary | ICD-10-CM | POA: Diagnosis not present

## 2022-01-03 DIAGNOSIS — M7989 Other specified soft tissue disorders: Secondary | ICD-10-CM | POA: Diagnosis not present

## 2022-01-04 ENCOUNTER — Telehealth: Payer: Self-pay | Admitting: General Practice

## 2022-01-04 NOTE — Telephone Encounter (Signed)
Transition Care Management Follow-up Telephone Call Date of discharge and from where: 01/03/22 from [facility] How have you been since you were released from the hospital? Doing better. Needs to follow up with ortho next week. Scheduled to see Dr. Darene Lamer on Tuesday. Any questions or concerns? No  Items Reviewed: Did the pt receive and understand the discharge instructions provided? Yes  Medications obtained and verified? No  Other? No  Any new allergies since your discharge? No  Dietary orders reviewed? Yes Do you have support at home? Yes   Home Care and Equipment/Supplies: Were home health services ordered? no  Functional Questionnaire: (I = Independent and D = Dependent) ADLs: I  Bathing/Dressing- I  Meal Prep- I  Eating- I  Maintaining continence- I  Transferring/Ambulation- I  Managing Meds- I  Follow up appointments reviewed:  PCP Hospital f/u appt confirmed? Yes  Scheduled to see Dr. Darene Lamer on 01/08/22 @ 0945. [facility] f/u appt confirmed? No   Are transportation arrangements needed? No  If their condition worsens, is the pt aware to call PCP or go to the Emergency Dept.? Yes Was the patient provided with contact information for the PCP's office or ED? Yes Was to pt encouraged to call back with questions or concerns? Yes

## 2022-01-07 DIAGNOSIS — Z9889 Other specified postprocedural states: Secondary | ICD-10-CM | POA: Diagnosis not present

## 2022-01-07 DIAGNOSIS — M25561 Pain in right knee: Secondary | ICD-10-CM | POA: Diagnosis not present

## 2022-01-08 ENCOUNTER — Ambulatory Visit (INDEPENDENT_AMBULATORY_CARE_PROVIDER_SITE_OTHER): Payer: BC Managed Care – PPO

## 2022-01-08 ENCOUNTER — Encounter: Payer: Self-pay | Admitting: Sports Medicine

## 2022-01-08 ENCOUNTER — Ambulatory Visit: Payer: BC Managed Care – PPO | Admitting: Sports Medicine

## 2022-01-08 DIAGNOSIS — M1711 Unilateral primary osteoarthritis, right knee: Secondary | ICD-10-CM

## 2022-01-08 LAB — CBC WITH DIFFERENTIAL/PLATELET
Absolute Monocytes: 614 cells/uL (ref 200–950)
Basophils Absolute: 47 cells/uL (ref 0–200)
Basophils Relative: 0.5 %
Eosinophils Absolute: 149 cells/uL (ref 15–500)
Eosinophils Relative: 1.6 %
HCT: 45.5 % — ABNORMAL HIGH (ref 35.0–45.0)
Hemoglobin: 15 g/dL (ref 11.7–15.5)
Lymphs Abs: 3162 cells/uL (ref 850–3900)
MCH: 29.8 pg (ref 27.0–33.0)
MCHC: 33 g/dL (ref 32.0–36.0)
MCV: 90.3 fL (ref 80.0–100.0)
MPV: 9.9 fL (ref 7.5–12.5)
Monocytes Relative: 6.6 %
Neutro Abs: 5329 cells/uL (ref 1500–7800)
Neutrophils Relative %: 57.3 %
Platelets: 317 10*3/uL (ref 140–400)
RBC: 5.04 10*6/uL (ref 3.80–5.10)
RDW: 12.5 % (ref 11.0–15.0)
Total Lymphocyte: 34 %
WBC: 9.3 10*3/uL (ref 3.8–10.8)

## 2022-01-08 LAB — SEDIMENTATION RATE: Sed Rate: 11 mm/h (ref 0–20)

## 2022-01-08 NOTE — Assessment & Plan Note (Addendum)
Tammy Casey returns, she is a pleasant 51 year old female, she has chronic right knee pain, treated conservatively. Unfortunately she failed conservative treatment including injections into the joint and the past anserine bursa. MRI ultimately showed meniscal tearing and patellofemoral and medial compartment chondromalacia. She had arthroscopic debridement and bursectomy with Dr. Griffin Basil but unfortunately continued to have pain for 4 months. She got a second opinion from Dr. Levada Dy with [facility], he suggested there is a potential infection and to have me work this up. There is also some color change and he brought the possibility of complex regional pain syndrome. On ultrasound she did not have any effusions so I am not really suspicious of infection, I did inject her knee with ultrasound guidance today. I would also like an updated MRI of the knee. We will do a CBC with differential and ESR and if negative it is likely that her underlying arthritic changes are causing her pain as the meniscus has already been addressed. We would then proceed with viscosupplementation.

## 2022-01-08 NOTE — Progress Notes (Signed)
    Procedures performed today:    Procedure: Real-time Ultrasound Guided injection of the right knee Device: Samsung HS60  Verbal informed consent obtained.  Time-out conducted.  Noted no overlying erythema, induration, or other signs of local infection.  Skin prepped in a sterile fashion.  Local anesthesia: Topical Ethyl chloride.  With sterile technique and under real time ultrasound guidance: No effusion noted, 1 cc Kenalog 40, 2 cc lidocaine, 2 cc bupivacaine injected easily Completed without difficulty  Advised to call if fevers/chills, erythema, induration, drainage, or persistent bleeding.  Images permanently stored and available for review in PACS.  Impression: Technically successful ultrasound guided injection.  Independent interpretation of notes and tests performed by another provider:   None.  Brief History, Exam, Impression, and Recommendations:    Primary osteoarthritis of right knee Tammy Casey returns, she is a pleasant 51 year old female, she has chronic right knee pain, treated conservatively. Unfortunately she failed conservative treatment including injections into the joint and the past anserine bursa. MRI ultimately showed meniscal tearing and patellofemoral and medial compartment chondromalacia. She had arthroscopic debridement and bursectomy with Dr. Griffin Basil but unfortunately continued to have pain for 4 months. She got a second opinion from Dr. Levada Dy with [facility], he suggested there is a potential infection and to have me work this up. There is also some color change and he brought the possibility of complex regional pain syndrome. On ultrasound she did not have any effusions so I am not really suspicious of infection, I did inject her knee with ultrasound guidance today. I would also like an updated MRI of the knee. We will do a CBC with differential and ESR and if negative it is likely that her underlying arthritic changes are causing her pain as  the meniscus has already been addressed. We would then proceed with viscosupplementation.    ____________________________________________ Gwen Her. Dianah Field, M.D., ABFM., CAQSM., AME. Primary Care and Sports Medicine [facility]  Adjunct Professor of [place] [employer] [facility] of Medicine  Risk manager

## 2022-01-09 ENCOUNTER — Encounter: Payer: Self-pay | Admitting: Sports Medicine

## 2022-01-14 ENCOUNTER — Other Ambulatory Visit: Payer: BC Managed Care – PPO

## 2022-01-17 ENCOUNTER — Ambulatory Visit
Admission: RE | Admit: 2022-01-17 | Discharge: 2022-01-17 | Disposition: A | Discharge status: Home / Self Care | Payer: BC Managed Care – PPO | Source: Ambulatory Visit | Attending: Sports Medicine | Admitting: Sports Medicine

## 2022-01-17 DIAGNOSIS — M25561 Pain in right knee: Secondary | ICD-10-CM | POA: Diagnosis not present

## 2022-01-17 DIAGNOSIS — M25461 Effusion, right knee: Secondary | ICD-10-CM | POA: Diagnosis not present

## 2022-01-17 DIAGNOSIS — M1711 Unilateral primary osteoarthritis, right knee: Secondary | ICD-10-CM

## 2022-01-21 ENCOUNTER — Telehealth: Payer: Self-pay | Admitting: Sports Medicine

## 2022-01-21 NOTE — Telephone Encounter (Signed)
Right knee [employer] approval please, x-ray and MRI confirmed osteoarthritis, failed over 6 weeks conservative treatment, including steroid injections.

## 2022-01-22 NOTE — Telephone Encounter (Signed)
[employer] paperwork faxed to [employer] at [phone] Request is for Orthovisc Pt's insurance prefers Orthovisc Fax confirmation receipt received  

## 2022-01-23 NOTE — Telephone Encounter (Signed)
Benefits Investigation Details received from [employer] Injection: Orthovisc  Medical: Product not covered.  PA required: No   Pharmacy: Product not covered.   Specialty Pharmacy:   May fill through:  OV Copay/Coinsurance:  Product Copay:  Administration Coinsurance:  Administration Copay:  Deductible: $ (met: $) Out of Pocket Max: $ (met: $)    Msg sen to [employer] to see if pt qualifies for the direct access program. Will await their response.

## 2022-02-04 NOTE — Telephone Encounter (Signed)
Msg in [employer] portal that they have been trying to reach pt. Spoke with patient to advise her that the company was trying to reach her and she needed to call them back.

## 2022-02-19 ENCOUNTER — Ambulatory Visit: Payer: BC Managed Care – PPO | Admitting: Sports Medicine

## 2022-10-17 ENCOUNTER — Encounter: Payer: Self-pay | Admitting: Medical-Surgical

## 2023-11-11 ENCOUNTER — Encounter: Payer: Self-pay | Admitting: Sports Medicine

## 2023-12-10 ENCOUNTER — Other Ambulatory Visit: Payer: Self-pay

## 2023-12-10 ENCOUNTER — Telehealth: Payer: Self-pay

## 2023-12-10 ENCOUNTER — Ambulatory Visit
Admission: RE | Admit: 2023-12-10 | Discharge: 2023-12-10 | Disposition: A | Discharge status: Home / Self Care | Attending: Family Medicine | Admitting: Family Medicine

## 2023-12-10 VITALS — BP 165/99 | HR 99 | Temp 97.7°F | Resp 16

## 2023-12-10 DIAGNOSIS — M545 Low back pain, unspecified: Secondary | ICD-10-CM

## 2023-12-10 DIAGNOSIS — S39012A Strain of muscle, fascia and tendon of lower back, initial encounter: Secondary | ICD-10-CM

## 2023-12-10 MED ORDER — HYDROCODONE-ACETAMINOPHEN 5-325 MG PO TABS: 2.0000 | ORAL_TABLET | ORAL | 0 refills | Status: DC | PRN

## 2023-12-10 MED ORDER — HYDROCODONE-ACETAMINOPHEN 5-325 MG PO TABS: 1.0000 | ORAL_TABLET | ORAL | 0 refills | Status: AC | PRN

## 2023-12-10 MED ORDER — METHYLPREDNISOLONE 4 MG PO TBPK: ORAL_TABLET | ORAL | 0 refills | Status: AC

## 2023-12-10 MED ORDER — CYCLOBENZAPRINE HCL 5 MG PO TABS: 5.0000 mg | ORAL_TABLET | Freq: Three times a day (TID) | ORAL | 0 refills | Status: AC | PRN

## 2023-12-10 NOTE — ED Triage Notes (Addendum)
 Lower middle and right side back pain since sunday. Was moving totes Sunday. Has been using heat, ice, soaking in tub, tylenol , ibuprofen. Pain is achy and sharp but sometimes feels like there is a catch.

## 2023-12-10 NOTE — Telephone Encounter (Signed)
 Called [facility] pharmacy to have rx for hydrocodone -acetaminophen  5/325mg  2 tablets every 4 hours cancelled. Pharmacy staff sts this was performed.

## 2023-12-10 NOTE — Telephone Encounter (Signed)
 Pt called, sts pharmacy will not fill rx for hydrocodone  as it is written. Dr. Maranda made aware, sts will rewrite it and send again.

## 2023-12-10 NOTE — ED Provider Notes (Signed)
 [facility]    CSN: [id number] Arrival date & time: 12/10/23  1540      History   Chief Complaint Chief Complaint  Patient presents with   Back Pain    HPI Tammy Casey is a 53 y.o. female.   HPI Patient states that she was cleaning up around her house and moved a lot of heavy totes and packages.  She states she did not feel pain at the time but later she developed pain in her right low back.  Has persisted ever since then.  With certain movements it catches and will immobilize her with pain.  The pain is in the right low back.  No radiation into hip or leg.  No numbness or weakness.  No history of back problems although she did have an MRI of her back done in 2019 for pain at that time.  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      Home Medications    Prior to Admission medications   Medication Sig Start Date End Date Taking? Authorizing Provider  cyclobenzaprine  (FLEXERIL ) 5 MG tablet Take 1 tablet (5 mg total) by mouth 3 (three) times daily as needed for muscle spasms. 12/10/23  Yes Maranda Jamee Jacob, MD  HYDROcodone -acetaminophen  (NORCO/VICODIN) 5-325 MG tablet Take 2 tablets by mouth every 4 (four) hours as needed. 12/10/23  Yes Maranda Jamee Jacob, MD  methylPREDNISolone  (MEDROL  DOSEPAK) 4 MG TBPK tablet tad 12/10/23  Yes Maranda Jamee Jacob, MD  NON FORMULARY [facility] allergy medicine   Yes [provider]    Family History Family History  Problem Relation Age of Onset   Hypertension Mother    Heart attack  Mother    Hypertension Maternal Aunt    Hypertension Maternal Uncle    Heart attack Maternal Uncle    Hypertension Maternal Grandmother    Hypertension Maternal Grandfather    Heart attack Maternal Grandfather     Social History Social History   Tobacco Use   Smoking status: Every Day    Current packs/day: 1.00    Average packs/day: 1 pack/day for 15.0 years (15.0 ttl pk-yrs)    Types: Cigarettes   Smokeless tobacco: Never  Vaping Use   Vaping status: Never Used  Substance Use Topics   Alcohol use: Yes    Alcohol/week: 1.0 standard drink of alcohol    Types: 1 Standard drinks or equivalent per week    Comment: occ   Drug use: Never     Allergies   Amoxicillin, Penicillin g, Azithromycin, and Cefdinir   Review of Systems Review of Systems See HPI  Physical Exam Triage Vital Signs ED Triage Vitals  Encounter Vitals Group     BP 12/10/23 1551 (!) 165/99     Girls Systolic BP Percentile --      Girls Diastolic BP Percentile --      Boys Systolic BP Percentile --      Boys Diastolic BP Percentile --  Pulse Rate 12/10/23 1551 99     Resp 12/10/23 1551 16     Temp 12/10/23 1551 97.7 F (36.5 C)     Temp src --      SpO2 12/10/23 1551 97 %     Weight --      Height --      Head Circumference --      Peak Flow --      Pain Score 12/10/23 1555 7     Pain Loc --      Pain Education --      Exclude from Growth Chart --    No data found.  Updated Vital Signs BP (!) 165/99   Pulse 99   Temp 97.7 F (36.5 C)   Resp 16   LMP 04/09/2010   SpO2 97%      Physical Exam Constitutional:      General: She is in acute distress.     Appearance: She is well-developed and normal weight.  HENT:     Head: Normocephalic and atraumatic.  Eyes:     Conjunctiva/sclera: Conjunctivae normal.     Pupils: Pupils are equal, round, and reactive to light.  Cardiovascular:     Rate and Rhythm: Normal rate.  Pulmonary:     Effort: Pulmonary effort is normal. No  respiratory distress.  Abdominal:     General: There is no distension.     Palpations: Abdomen is soft.  Musculoskeletal:        General: Tenderness present. Normal range of motion.     Cervical back: Normal range of motion.     Comments: Patient has guarded movements.  Acutely uncomfortable.  Tenderness in the right lumbar column of muscles and in the right SI joint.  Strength sensation range of motion and reflexes are normal in lower extremities.  Straight leg raise negative  Skin:    General: Skin is warm and dry.  Neurological:     General: No focal deficit present.     Mental Status: She is alert.     Gait: Gait normal.      UC Treatments / Results  Labs (all labs ordered are listed, but only abnormal results are displayed) Labs Reviewed - No data to display  EKG   Radiology No results found.  Procedures Procedures (including critical care time)  Medications Ordered in UC Medications - No data to display  Initial Impression / Assessment and Plan / UC Course  I have reviewed the triage vital signs and the nursing notes.  Pertinent labs & imaging results that were available during my care of the patient were reviewed by me and considered in my medical decision making (see chart for details).     Discussed muscular back pain.  No indication for x-rays at this patient fails to improve with conservative management Final Clinical Impressions(s) / UC Diagnoses   Final diagnoses:  Strain of lumbar region, initial encounter  Acute right-sided low back pain without sciatica     Discharge Instructions      Take the Dosepak as directed Take all of day 1 today as a single dose when you get the prescription filled Take Flexeril  as needed as muscle relaxer.  This is useful to take at bedtime, it will help you sleep.  Be careful taking it during the day when you are working Take hydrocodone  if needed for severe pain.  Do not drive on the hydrocodone  Limit your activity  while your back is painful See your  doctor if not improving by next week   ED Prescriptions     Medication Sig Dispense Auth. Provider   methylPREDNISolone  (MEDROL  DOSEPAK) 4 MG TBPK tablet tad 21 tablet Maranda Jamee Jacob, MD   cyclobenzaprine  (FLEXERIL ) 5 MG tablet Take 1 tablet (5 mg total) by mouth 3 (three) times daily as needed for muscle spasms. 30 tablet Maranda Jamee Jacob, MD   HYDROcodone -acetaminophen  (NORCO/VICODIN) 5-325 MG tablet Take 2 tablets by mouth every 4 (four) hours as needed. 6 tablet Maranda Jamee Jacob, MD      I have reviewed the PDMP during this encounter.   Maranda Jamee Jacob, MD 12/10/23 [id number]

## 2023-12-10 NOTE — Discharge Instructions (Signed)
 Take the Dosepak as directed Take all of day 1 today as a single dose when you get the prescription filled Take Flexeril  as needed as muscle relaxer.  This is useful to take at bedtime, it will help you sleep.  Be careful taking it during the day when you are working Take hydrocodone  if needed for severe pain.  Do not drive on the hydrocodone  Limit your activity while your back is painful See your doctor if not improving by next week
